# Patient Record
Sex: Female | Born: 1975 | Race: Black or African American | Hispanic: No | Smoking: Never smoker
Health system: Southern US, Community
[De-identification: ages and names within clinical notes are randomized; demographics above are authoritative.]

## PROBLEM LIST (undated history)

## (undated) DIAGNOSIS — T7840XA Allergy, unspecified, initial encounter: Secondary | ICD-10-CM

## (undated) DIAGNOSIS — R569 Unspecified convulsions: Secondary | ICD-10-CM

## (undated) DIAGNOSIS — J45909 Unspecified asthma, uncomplicated: Secondary | ICD-10-CM

## (undated) HISTORY — PX: BILATERAL CARPAL TUNNEL RELEASE: SHX6508

## (undated) HISTORY — DX: Unspecified asthma, uncomplicated: J45.909

## (undated) HISTORY — DX: Allergy, unspecified, initial encounter: T78.40XA

---

## 2007-06-08 ENCOUNTER — Emergency Department (HOSPITAL_COMMUNITY): Admission: EM | Admit: 2007-06-08 | Discharge: 2007-06-08 | Payer: Self-pay | Admitting: Emergency Medicine

## 2007-07-27 ENCOUNTER — Emergency Department (HOSPITAL_COMMUNITY): Admission: EM | Admit: 2007-07-27 | Discharge: 2007-07-27 | Payer: Self-pay | Admitting: Emergency Medicine

## 2009-05-25 ENCOUNTER — Emergency Department (HOSPITAL_COMMUNITY): Admission: EM | Admit: 2009-05-25 | Discharge: 2009-05-25 | Payer: Self-pay | Admitting: Emergency Medicine

## 2009-08-21 ENCOUNTER — Emergency Department (HOSPITAL_COMMUNITY): Admission: EM | Admit: 2009-08-21 | Discharge: 2009-08-21 | Payer: Self-pay | Admitting: Emergency Medicine

## 2010-06-08 ENCOUNTER — Ambulatory Visit: Payer: Self-pay | Admitting: Cardiology

## 2010-10-30 LAB — DIFFERENTIAL
Basophils Absolute: 0 10*3/uL (ref 0.0–0.1)
Eosinophils Absolute: 0.3 10*3/uL (ref 0.0–0.7)
Lymphocytes Relative: 18 % (ref 12–46)
Lymphs Abs: 1.1 10*3/uL (ref 0.7–4.0)
Monocytes Absolute: 0.7 10*3/uL (ref 0.1–1.0)
Neutrophils Relative %: 65 % (ref 43–77)

## 2010-10-30 LAB — CBC
Hemoglobin: 12.1 g/dL (ref 12.0–15.0)
Platelets: 270 10*3/uL (ref 150–400)

## 2010-10-30 LAB — BASIC METABOLIC PANEL
BUN: 8 mg/dL (ref 6–23)
CO2: 27 mEq/L (ref 19–32)
Chloride: 105 mEq/L (ref 96–112)
GFR calc Af Amer: >60 mL/min (ref >60)
GFR calc non Af Amer: >60 mL/min (ref >60)
Glucose, Bld: 91 mg/dL (ref 70–99)
Potassium: 3.7 mEq/L (ref 3.5–5.1)
Sodium: 138 mEq/L (ref 135–145)

## 2011-05-22 LAB — STREP A DNA PROBE: Group A Strep Probe: NEGATIVE

## 2011-05-24 LAB — CBC
HCT: 33.2 — ABNORMAL LOW
Hemoglobin: 11.4 — ABNORMAL LOW
Platelets: 289
RBC: 3.66 — ABNORMAL LOW
RDW: 12.8

## 2011-05-24 LAB — DIFFERENTIAL
Basophils Absolute: 0
Eosinophils Relative: 8 — ABNORMAL HIGH
Lymphocytes Relative: 26
Lymphs Abs: 2.2
Neutro Abs: 4.7

## 2011-05-24 LAB — BASIC METABOLIC PANEL
CO2: 27
Calcium: 9.4
GFR calc Af Amer: >60
Glucose, Bld: 91

## 2011-05-24 LAB — POCT CARDIAC MARKERS
CKMB, poc: <1 — ABNORMAL LOW
Myoglobin, poc: 40.5
Troponin i, poc: <0.05

## 2013-09-05 ENCOUNTER — Emergency Department (HOSPITAL_COMMUNITY)
Admission: EM | Admit: 2013-09-05 | Discharge: 2013-09-05 | Disposition: A | Discharge status: Home / Self Care | Payer: PRIVATE HEALTH INSURANCE | Attending: Emergency Medicine | Admitting: Emergency Medicine

## 2013-09-05 ENCOUNTER — Encounter (HOSPITAL_COMMUNITY): Payer: Self-pay | Admitting: Emergency Medicine

## 2013-09-05 DIAGNOSIS — Z3202 Encounter for pregnancy test, result negative: Secondary | ICD-10-CM | POA: Insufficient documentation

## 2013-09-05 DIAGNOSIS — R110 Nausea: Secondary | ICD-10-CM | POA: Insufficient documentation

## 2013-09-05 LAB — COMPREHENSIVE METABOLIC PANEL
ALK PHOS: 62 U/L (ref 39–117)
ALT: 12 U/L (ref 0–35)
AST: 12 U/L (ref 0–37)
Albumin: 3.4 g/dL — ABNORMAL LOW (ref 3.5–5.2)
BUN: 15 mg/dL (ref 6–23)
CHLORIDE: 103 meq/L (ref 96–112)
CO2: 24 mEq/L (ref 19–32)
CREATININE: 0.73 mg/dL (ref 0.50–1.10)
Calcium: 8.4 mg/dL (ref 8.4–10.5)
GFR calc non Af Amer: >90 mL/min (ref >90)
Glucose, Bld: 102 mg/dL — ABNORMAL HIGH (ref 70–99)
Potassium: 3.8 mEq/L (ref 3.7–5.3)
SODIUM: 138 meq/L (ref 137–147)
TOTAL PROTEIN: 6.7 g/dL (ref 6.0–8.3)
Total Bilirubin: 0.2 mg/dL — ABNORMAL LOW (ref 0.3–1.2)

## 2013-09-05 LAB — CBC WITH DIFFERENTIAL/PLATELET
BASOS PCT: 0 % (ref 0–1)
Basophils Absolute: 0 10*3/uL (ref 0.0–0.1)
EOS PCT: 4 % (ref 0–5)
Eosinophils Absolute: 0.4 10*3/uL (ref 0.0–0.7)
HEMATOCRIT: 34.2 % — AB (ref 36.0–46.0)
HEMOGLOBIN: 11.8 g/dL — AB (ref 12.0–15.0)
LYMPHS ABS: 1.6 10*3/uL (ref 0.7–4.0)
LYMPHS PCT: 17 % (ref 12–46)
MCH: 31.1 pg (ref 26.0–34.0)
MCHC: 34.5 g/dL (ref 30.0–36.0)
MCV: 90 fL (ref 78.0–100.0)
MONO ABS: 0.8 10*3/uL (ref 0.1–1.0)
Monocytes Relative: 9 % (ref 3–12)
Neutro Abs: 6.6 10*3/uL (ref 1.7–7.7)
Neutrophils Relative %: 70 % (ref 43–77)
Platelets: 248 10*3/uL (ref 150–400)
RBC: 3.8 MIL/uL — ABNORMAL LOW (ref 3.87–5.11)
RDW: 12.6 % (ref 11.5–15.5)
WBC: 9.4 10*3/uL (ref 4.0–10.5)

## 2013-09-05 LAB — URINALYSIS, ROUTINE W REFLEX MICROSCOPIC
Bilirubin Urine: NEGATIVE
GLUCOSE, UA: NEGATIVE mg/dL
KETONES UR: NEGATIVE mg/dL
NITRITE: NEGATIVE
PH: 5.5 (ref 5.0–8.0)
Protein, ur: NEGATIVE mg/dL
Specific Gravity, Urine: 1.022 (ref 1.005–1.030)
UROBILINOGEN UA: 0.2 mg/dL (ref 0.0–1.0)

## 2013-09-05 LAB — URINE MICROSCOPIC-ADD ON

## 2013-09-05 LAB — POCT PREGNANCY, URINE: PREG TEST UR: NEGATIVE

## 2013-09-05 MED ORDER — PROMETHAZINE HCL 25 MG PO TABS: 25.0000 mg | ORAL_TABLET | Freq: Four times a day (QID) | ORAL | Status: DC | PRN

## 2013-09-05 NOTE — Discharge Instructions (Signed)
Nausea, Adult Nausea is the feeling that you have an upset stomach or have to vomit. Nausea by itself is not likely a serious concern, but it may be an early sign of more serious medical problems. As nausea gets worse, it can lead to vomiting. If vomiting develops, there is the risk of dehydration.  CAUSES   Viral infections.  Food poisoning.  Medicines.  Pregnancy.  Motion sickness.  Migraine headaches.  Emotional distress.  Severe pain from any source.  Alcohol intoxication. HOME CARE INSTRUCTIONS  Get plenty of rest.  Ask your caregiver about specific rehydration instructions.  Eat small amounts of food and sip liquids more often.  Take all medicines as told by your caregiver. SEEK MEDICAL CARE IF:  You have not improved after 2 days, or you get worse.  You have a headache. SEEK IMMEDIATE MEDICAL CARE IF:   You have a fever.  You faint.  You keep vomiting or have blood in your vomit.  You are extremely weak or dehydrated.  You have dark or bloody stools.  You have severe chest or abdominal pain. MAKE SURE YOU:  Understand these instructions.  Will watch your condition.  Will get help right away if you are not doing well or get worse. Document Released: 09/07/2004 Document Revised: 04/24/2012 Document Reviewed: 04/12/2011 [employer] Patient Information 2014 [employer].  

## 2013-09-05 NOTE — ED Notes (Signed)
Pt refused pelvic exam.  Stated "I just wanted to know if I was pregnant.  That came back and I am not so I want to go now."  Dr Cheri Guppy informed.  Pt to be discharged.

## 2013-09-05 NOTE — ED Notes (Addendum)
Pt states she has been having abd pain for four days.  Pt states last BM over one day prior which is abnormal for her.  Pt states some nausea, but denies vomiting and breast tenderness.

## 2013-09-05 NOTE — ED Provider Notes (Signed)
CSN: [id number]     Arrival date & time 09/05/13  0212 History   First MD Initiated Contact with Patient 09/05/13 0304     Chief Complaint  Patient presents with  . Abdominal Pain   (Consider location/radiation/quality/duration/timing/severity/associated sxs/prior Treatment) HPI This patient is a generally healthy 47 old woman who presents to the emergency department because she's concerned she may be pregnant. She has not taken home pregnancy test. Her last menstrual period was 3 weeks ago. She has felt intermittently nauseated for the past couple of days and has had some very mild and intermittent suprapubic discomfort. She has not appreciated any unusual vaginal discharge or drainage. She denies dysuria. No fever. She has not had any vomiting. She has not done home pregnancy test.   History reviewed. No pertinent past medical history. History reviewed. No pertinent past surgical history. No family history on file. History  Substance Use Topics  . Smoking status: Never Smoker   . Smokeless tobacco: Not on file  . Alcohol Use: No   OB History   Grav Para Term Preterm Abortions TAB SAB Ect Mult Living                 Review of Systems Ten point review of symptoms performed and is negative with the exception of symptoms noted above.   Allergies  Review of patient's allergies indicates no known allergies.  Home Medications  No current outpatient prescriptions on file. LMP 08/19/2013 Physical Exam Gen: well developed and well nourished appearing Head: NCAT Eyes: PERL, EOMI Nose: no epistaixis or rhinorrhea Mouth/throat: mucosa is moist and pink Neck: supple, no stridor Lungs: CTA B, no wheezing, rhonchi or rales CV: RRR, no murmur, extremities appear well perfused.  Abd: soft, notender, nondistended Back: no ttp, no cva ttp Skin: warm and dry Ext: normal to inspection, no dependent edema Neuro: CN ii-xii grossly intact, no focal deficits Psyche; normal affect,  calm and  cooperative.   ED Course  Procedures (including critical care time) Labs Review Results for orders placed during the hospital encounter of 09/05/13 (from the past 24 hour(s))  URINALYSIS, ROUTINE W REFLEX MICROSCOPIC     Status: Abnormal   Collection Time    09/05/13  2:34 AM      Result Value Range   Color, Urine YELLOW  YELLOW   APPearance CLOUDY (*) CLEAR   Specific Gravity, Urine 1.022  1.005 - 1.030   pH 5.5  5.0 - 8.0   Glucose, UA NEGATIVE  NEGATIVE mg/dL   Hgb urine dipstick LARGE (*) NEGATIVE   Bilirubin Urine NEGATIVE  NEGATIVE   Ketones, ur NEGATIVE  NEGATIVE mg/dL   Protein, ur NEGATIVE  NEGATIVE mg/dL   Urobilinogen, UA 0.2  0.0 - 1.0 mg/dL   Nitrite NEGATIVE  NEGATIVE   Leukocytes, UA SMALL (*) NEGATIVE  URINE MICROSCOPIC-ADD ON     Status: Abnormal   Collection Time    09/05/13  2:34 AM      Result Value Range   Squamous Epithelial / LPF FEW (*) RARE   WBC, UA 0-2  <3 WBC/hpf   Bacteria, UA FEW (*) RARE  POCT PREGNANCY, URINE     Status: None   Collection Time    09/05/13  2:38 AM      Result Value Range   Preg Test, Ur NEGATIVE  NEGATIVE  CBC WITH DIFFERENTIAL     Status: Abnormal   Collection Time    09/05/13  2:46 AM  Result Value Range   WBC 9.4  4.0 - 10.5 K/uL   RBC 3.80 (*) 3.87 - 5.11 MIL/uL   Hemoglobin 11.8 (*) 12.0 - 15.0 g/dL   HCT 34.2 (*) 36.0 - 46.0 %   MCV 90.0  78.0 - 100.0 fL   MCH 31.1  26.0 - 34.0 pg   MCHC 34.5  30.0 - 36.0 g/dL   RDW 12.6  11.5 - 15.5 %   Platelets 248  150 - 400 K/uL   Neutrophils Relative % 70  43 - 77 %   Neutro Abs 6.6  1.7 - 7.7 K/uL   Lymphocytes Relative 17  12 - 46 %   Lymphs Abs 1.6  0.7 - 4.0 K/uL   Monocytes Relative 9  3 - 12 %   Monocytes Absolute 0.8  0.1 - 1.0 K/uL   Eosinophils Relative 4  0 - 5 %   Eosinophils Absolute 0.4  0.0 - 0.7 K/uL   Basophils Relative 0  0 - 1 %   Basophils Absolute 0.0  0.0 - 0.1 K/uL  COMPREHENSIVE METABOLIC PANEL     Status: Abnormal   Collection Time     09/05/13  2:46 AM      Result Value Range   Sodium 138  137 - 147 mEq/L   Potassium 3.8  3.7 - 5.3 mEq/L   Chloride 103  96 - 112 mEq/L   CO2 24  19 - 32 mEq/L   Glucose, Bld 102 (*) 70 - 99 mg/dL   BUN 15  6 - 23 mg/dL   Creatinine, Ser 0.73  0.50 - 1.10 mg/dL   Calcium 8.4  8.4 - 10.5 mg/dL   Total Protein 6.7  6.0 - 8.3 g/dL   Albumin 3.4 (*) 3.5 - 5.2 g/dL   AST 12  0 - 37 U/L   ALT 12  0 - 35 U/L   Alkaline Phosphatase 62  39 - 117 U/L   Total Bilirubin 0.2 (*) 0.3 - 1.2 mg/dL   GFR calc non Af Amer >90  >90 mL/min   GFR calc Af Amer >90  >90 mL/min    MDM  DDX: uti, cervicitis, pid, functional abdominal pain, endometriosis, complication of pregnancy  0345:  I have been informed by the patient's nurse that she declines pelvic exam and came to the emergency department for a pregnancy test and would no like to be discharged.   Elyn Peers, MD 09/05/13 [phone]

## 2013-09-06 ENCOUNTER — Encounter (HOSPITAL_COMMUNITY): Payer: Self-pay | Admitting: Emergency Medicine

## 2013-09-06 ENCOUNTER — Emergency Department (HOSPITAL_COMMUNITY): Payer: PRIVATE HEALTH INSURANCE

## 2013-09-06 ENCOUNTER — Emergency Department (HOSPITAL_COMMUNITY)
Admission: EM | Admit: 2013-09-06 | Discharge: 2013-09-06 | Disposition: A | Discharge status: Home / Self Care | Payer: PRIVATE HEALTH INSURANCE | Attending: Emergency Medicine | Admitting: Emergency Medicine

## 2013-09-06 DIAGNOSIS — N949 Unspecified condition associated with female genital organs and menstrual cycle: Secondary | ICD-10-CM | POA: Insufficient documentation

## 2013-09-06 DIAGNOSIS — N938 Other specified abnormal uterine and vaginal bleeding: Secondary | ICD-10-CM | POA: Insufficient documentation

## 2013-09-06 DIAGNOSIS — N939 Abnormal uterine and vaginal bleeding, unspecified: Secondary | ICD-10-CM

## 2013-09-06 DIAGNOSIS — Z3202 Encounter for pregnancy test, result negative: Secondary | ICD-10-CM | POA: Insufficient documentation

## 2013-09-06 DIAGNOSIS — N6459 Other signs and symptoms in breast: Secondary | ICD-10-CM | POA: Insufficient documentation

## 2013-09-06 DIAGNOSIS — N946 Dysmenorrhea, unspecified: Secondary | ICD-10-CM | POA: Insufficient documentation

## 2013-09-06 LAB — WET PREP, GENITAL
TRICH WET PREP: NONE SEEN
YEAST WET PREP: NONE SEEN

## 2013-09-06 LAB — POCT PREGNANCY, URINE: Preg Test, Ur: NEGATIVE

## 2013-09-06 MED ORDER — ACETAMINOPHEN 325 MG PO TABS
650.0000 mg | ORAL_TABLET | Freq: Once | ORAL | Status: DC
  Administered 2013-09-06: 650 mg via ORAL
  Filled 2013-09-06: qty 2

## 2013-09-06 MED ORDER — ACETAMINOPHEN 325 MG PO TABS: 650.0000 mg | ORAL_TABLET | Freq: Once | ORAL | Status: AC

## 2013-09-06 MED ORDER — HYDROCODONE-ACETAMINOPHEN 5-325 MG PO TABS: 1.0000 | ORAL_TABLET | ORAL | Status: DC | PRN

## 2013-09-06 NOTE — ED Notes (Signed)
Pt still out of the dept at this time

## 2013-09-06 NOTE — Discharge Instructions (Signed)
Abnormal Uterine Bleeding Abnormal uterine bleeding can affect women at various stages in life, including teenagers, women in their reproductive years, pregnant women, and women who have reached menopause. Several kinds of uterine bleeding are considered abnormal, including:  Bleeding or spotting between periods.   Bleeding after sexual intercourse.   Bleeding that is heavier or more than normal.   Periods that last longer than usual.  Bleeding after menopause.  Many cases of abnormal uterine bleeding are minor and simple to treat, while others are more serious. Any type of abnormal bleeding should be evaluated by your health care provider. Treatment will depend on the cause of the bleeding. HOME CARE INSTRUCTIONS Monitor your condition for any changes. The following actions may help to alleviate any discomfort you are experiencing:  Avoid the use of tampons and douches as directed by your health care provider.  Change your pads frequently. You should get regular pelvic exams and Pap tests. Keep all follow-up appointments for diagnostic tests as directed by your health care provider.  SEEK MEDICAL CARE IF:   Your bleeding lasts more than 1 week.   You feel dizzy at times.  SEEK IMMEDIATE MEDICAL CARE IF:   You pass out.   You are changing pads every 15 to 30 minutes.   You have abdominal pain.  You have a fever.   You become sweaty or weak.   You are passing large blood clots from the vagina.   You start to feel nauseous and vomit. MAKE SURE YOU:   Understand these instructions.  Will watch your condition.  Will get help right away if you are not doing well or get worse. Document Released: 07/31/2005 Document Revised: 04/02/2013 Document Reviewed: 02/27/2013 [facility] Patient Information 2014 [place], [place].  Dysmenorrhea Menstrual cramps (dysmenorrhea) are caused by the muscles of the uterus tightening (contracting) during a menstrual period. For some  women, this discomfort is merely bothersome. For others, dysmenorrhea can be severe enough to interfere with everyday activities for a few days each month. Primary dysmenorrhea is menstrual cramps that last a couple of days when you start having menstrual periods or soon after. This often begins after a teenager starts having her period. As a woman gets older or has a baby, the cramps will usually lessen or disappear. Secondary dysmenorrhea begins later in life, lasts longer, and the pain may be stronger than primary dysmenorrhea. The pain may start before the period and last a few days after the period.  CAUSES  Dysmenorrhea is usually caused by an underlying problem, such as:  The tissue lining the uterus grows outside of the uterus in other areas of the body (endometriosis).  The endometrial tissue, which normally lines the uterus, is found in or grows into the muscular walls of the uterus (adenomyosis).  The pelvic blood vessels are engorged with blood just before the menstrual period (pelvic congestive syndrome).  Overgrowth of cells (polyps) in the lining of the uterus or cervix.  Falling down of the uterus (prolapse) because of loose or stretched ligaments.  Depression.  Bladder problems, infection, or inflammation.  Problems with the intestine, a tumor, or irritable bowel syndrome.  Cancer of the female organs or bladder.  A severely tipped uterus.  A very tight opening or closed cervix.  Noncancerous tumors of the uterus (fibroids).  Pelvic inflammatory disease (PID).  Pelvic scarring (adhesions) from a previous surgery.  Ovarian cyst.  An intrauterine device (IUD) used for birth control. RISK FACTORS You may be at greater risk  of dysmenorrhea if:  You are younger than age 80.  You started puberty early.  You have irregular or heavy bleeding.  You have never given birth.  You have a family history of this problem.  You are a smoker. SIGNS AND SYMPTOMS    Cramping or throbbing pain in your lower abdomen.  Headaches.  Lower back pain.  Nausea or vomiting.  Diarrhea.  Sweating or dizziness.  Loose stools. DIAGNOSIS  A diagnosis is based on your history, symptoms, physical exam, diagnostic tests, or procedures. Diagnostic tests or procedures may include:  Blood tests.  Ultrasonography.  An examination of the lining of the uterus (dilation and curettage, D&C).  An examination inside your abdomen or pelvis with a scope (laparoscopy).  X-rays.  CT scan.  MRI.  An examination inside the bladder with a scope (cystoscopy).  An examination inside the intestine or stomach with a scope (colonoscopy, gastroscopy). TREATMENT  Treatment depends on the cause of the dysmenorrhea. Treatment may include:  Pain medicine prescribed by your health care provider.  Birth control pills or an IUD with progesterone hormone in it.  Hormone replacement therapy.  Nonsteroidal anti-inflammatory drugs (NSAIDs). These may help stop the production of prostaglandins.  Surgery to remove adhesions, endometriosis, ovarian cyst, or fibroids.  Removal of the uterus (hysterectomy).  Progesterone shots to stop the menstrual period.  Cutting the nerves on the sacrum that go to the female organs (presacral neurectomy).  Electric current to the sacral nerves (sacral nerve stimulation).  Antidepressant medicine.  Psychiatric therapy, counseling, or group therapy.  Exercise and physical therapy.  Meditation and yoga therapy.  Acupuncture. HOME CARE INSTRUCTIONS   Only take over-the-counter or prescription medicines as directed by your health care provider.  Place a heating pad or hot water bottle on your lower back or abdomen. Do not sleep with the heating pad.  Use aerobic exercises, walking, swimming, biking, and other exercises to help lessen the cramping.  Massage to the lower back or abdomen may help.  Stop smoking.  Avoid alcohol  and caffeine. SEEK MEDICAL CARE IF:   Your pain does not get better with medicine.  You have pain with sexual intercourse.  Your pain increases and is not controlled with medicines.  You have abnormal vaginal bleeding with your period.  You develop nausea or vomiting with your period that is not controlled with medicine. SEEK IMMEDIATE MEDICAL CARE IF:  You pass out.  Document Released: 07/31/2005 Document Revised: 04/02/2013 Document Reviewed: 01/16/2013 [facility] Patient Information 2014 [place].

## 2013-09-06 NOTE — ED Notes (Signed)
Generalized abdominal pain with nausea. Pt. Denies any vomiting.  Pt. Also reports having drainage from her breasts with sorenes.   Denies any UTI symptoms.  Pt. Is having vaginal bleeding

## 2013-09-06 NOTE — ED Provider Notes (Signed)
CSN: [id number]     Arrival date & time 09/06/13  0709 History   First MD Initiated Contact with Patient 09/06/13 [phone]     Chief Complaint  Patient presents with  . Abdominal Pain   (Consider location/radiation/quality/duration/timing/severity/associated sxs/prior Treatment) HPI  Megan Harvey is a 38 y.o.female without any significant PMH presents to the ER with complaints of suprapubic cramping, nausea, vaginal bleeding and bilateral breast discharge. She was seen yesterday for the same, had blood work and a urine pregnancy test done, all of which were WNL, negative urine preg. She declined pelvic exam at the time because she was feeling better and asked to go home.  This morning she comes back because she feels as though the bleeding is more severe. She is concerned she is pregnant and may be loosing the baby. She is very tearful. Denies urinary symptoms. Denies diffuse abdominal pain. Denies pain in her breasts.    History reviewed. No pertinent past medical history. History reviewed. No pertinent past surgical history. No family history on file. History  Substance Use Topics  . Smoking status: Never Smoker   . Smokeless tobacco: Not on file  . Alcohol Use: No   OB History   Grav Para Term Preterm Abortions TAB SAB Ect Mult Living                 Review of Systems The patient denies anorexia, fever, weight loss,, vision loss, decreased hearing, hoarseness, chest pain, syncope, dyspnea on exertion, peripheral edema, balance deficits, hemoptysis, abdominal pain, melena, hematochezia, severe indigestion/heartburn, hematuria, incontinence, genital sores, muscle weakness, suspicious skin lesions, transient blindness, difficulty walking, depression, unusual weight change, abnormal bleeding, enlarged lymph nodes, angioedema, and breast masses.  Allergies  Review of patient's allergies indicates no known allergies.  Home Medications   Current Outpatient Rx  Name  Route  Sig   Dispense  Refill  . promethazine (PHENERGAN) 25 MG tablet   Oral   Take 1 tablet (25 mg total) by mouth every 6 (six) hours as needed for nausea or vomiting.   30 tablet   0    BP 107/75  Pulse 75  Temp(Src) 98.8 F (37.1 C) (Oral)  Resp 20  SpO2 98%  LMP 08/20/2013 Physical Exam  Nursing note and vitals reviewed. Constitutional: She appears well-developed and well-nourished. No distress.  HENT:  Head: Normocephalic and atraumatic.  Eyes: Pupils are equal, round, and reactive to light.  Neck: Normal range of motion. Neck supple.  Cardiovascular: Normal rate and regular rhythm.   Pulmonary/Chest: Effort normal.  Abdominal: Soft.  Genitourinary: Uterus is tender. Right adnexum displays tenderness and fullness. There is tenderness and bleeding around the vagina.  Neurological: She is alert.  Skin: Skin is warm and dry.    ED Course  Procedures (including critical care time) Labs Review Labs Reviewed  WET PREP, GENITAL - Abnormal; Notable for the following:    Clue Cells Wet Prep HPF POC RARE (*)    WBC, Wet Prep HPF POC RARE (*)    All other components within normal limits  GC/CHLAMYDIA PROBE AMP  POCT PREGNANCY, URINE   Imaging Review US Transvaginal Non-ob  09/06/2013   CLINICAL DATA:  Vaginal bleeding, right-sided adnexal tenderness and cervical motion tenderness at physical exam  EXAM: TRANSABDOMINAL AND TRANSVAGINAL ULTRASOUND OF PELVIS  TECHNIQUE: Both transabdominal and transvaginal ultrasound examinations of the pelvis were performed. Transabdominal technique was performed for global imaging of the pelvis including uterus, ovaries, adnexal regions, and pelvic cul-de-sac.  It was necessary to proceed with endovaginal exam following the transabdominal exam to visualize the endometrium and ovaries.  COMPARISON:  None  FINDINGS: Uterus  Measurements: 7.7 x 5.7 x 4.4 cm. Anteverted, anteflexed. No focal abnormality.  Endometrium  Thickness: 5 mm.  Uniformly echogenic without  focal abnormality.  Right ovary  Measurements: 2.6 x 1.7 x 1.5 cm. Not well visualized due to patient discomfort but unremarkable in appearance. Normal appearance/no adnexal mass.  Left ovary  Measurements: 2.3 x 1.7 x 1.5 cm. Normal appearance/no adnexal mass.  Other findings  Trace free fluid identified.  IMPRESSION: No acute intrapelvic abnormality.   Electronically Signed   By: Conchita Paris M.D.   On: 09/06/2013 10:04   US Pelvis Complete  09/06/2013   CLINICAL DATA:  Vaginal bleeding, right-sided adnexal tenderness and cervical motion tenderness at physical exam  EXAM: TRANSABDOMINAL AND TRANSVAGINAL ULTRASOUND OF PELVIS  TECHNIQUE: Both transabdominal and transvaginal ultrasound examinations of the pelvis were performed. Transabdominal technique was performed for global imaging of the pelvis including uterus, ovaries, adnexal regions, and pelvic cul-de-sac. It was necessary to proceed with endovaginal exam following the transabdominal exam to visualize the endometrium and ovaries.  COMPARISON:  None  FINDINGS: Uterus  Measurements: 7.7 x 5.7 x 4.4 cm. Anteverted, anteflexed. No focal abnormality.  Endometrium  Thickness: 5 mm.  Uniformly echogenic without focal abnormality.  Right ovary  Measurements: 2.6 x 1.7 x 1.5 cm. Not well visualized due to patient discomfort but unremarkable in appearance. Normal appearance/no adnexal mass.  Left ovary  Measurements: 2.3 x 1.7 x 1.5 cm. Normal appearance/no adnexal mass.  Other findings  Trace free fluid identified.  IMPRESSION: No acute intrapelvic abnormality.   Electronically Signed   By: Conchita Paris M.D.   On: 09/06/2013 10:04    EKG Interpretation   None       MDM   1. Dysmenorrhea   2. Abnormal uterine bleeding     CMP, urinalysis, cbc done yesterday WNL. Urine preg done yesterday- negative Urine preg today- negative  Patient has abnormal pelvic exam with significant tenderness, will obtain US of uterus (NON-OB) to evaluate  further.  Patients ultrasounds is reassure. No masses, endometriosis or fibroids. Pt is having an abnormal menstrual cycle. Will refer to [facility]' clinic for further treatment and evaluation. 38 y.o.Megan Harvey Garret McGhee's evaluation in the Emergency Department is complete. It has been determined that no acute conditions requiring further emergency intervention are present at this time. The patient/guardian have been advised of the diagnosis and plan. We have discussed signs and symptoms that warrant return to the ED, such as changes or worsening in symptoms.  Vital signs are stable at discharge. Filed Vitals:   09/06/13 0713  BP: 107/75  Pulse: 75  Temp: 98.8 F (37.1 C)  Resp: 20    Patient/guardian has voiced understanding and agreed to follow-up with the PCP or specialist.       Linus Mako, PA-C 09/06/13 1010

## 2013-09-06 NOTE — ED Notes (Signed)
Pelvic cart set up at bed side, pt removing clothes from waist down; Tiffany PA notified

## 2013-09-06 NOTE — ED Notes (Signed)
Patient discharged using the teach back method,she verbalizes an understanding

## 2013-09-06 NOTE — ED Notes (Signed)
Tylenol pending patients return from [place].

## 2013-09-06 NOTE — ED Notes (Signed)
Back from US.

## 2013-09-06 NOTE — ED Notes (Signed)
Patient transported to Ultrasound 

## 2013-09-08 LAB — GC/CHLAMYDIA PROBE AMP
CT PROBE, AMP APTIMA: NEGATIVE
GC PROBE AMP APTIMA: NEGATIVE

## 2013-09-11 ENCOUNTER — Ambulatory Visit: Payer: Self-pay | Admitting: Obstetrics & Gynecology

## 2013-09-12 NOTE — ED Provider Notes (Signed)
Medical screening examination/treatment/procedure(s) were performed by non-physician practitioner and as supervising physician I was immediately available for consultation/collaboration.  Leota Jacobsen, MD 09/12/13 [phone]

## 2013-12-02 ENCOUNTER — Ambulatory Visit: Payer: Self-pay

## 2014-03-15 ENCOUNTER — Emergency Department (HOSPITAL_COMMUNITY)
Admission: EM | Admit: 2014-03-15 | Discharge: 2014-03-15 | Discharge status: Against Medical Advice | Payer: PRIVATE HEALTH INSURANCE | Attending: Emergency Medicine | Admitting: Emergency Medicine

## 2014-03-15 ENCOUNTER — Emergency Department (HOSPITAL_COMMUNITY): Payer: PRIVATE HEALTH INSURANCE

## 2014-03-15 ENCOUNTER — Encounter (HOSPITAL_COMMUNITY): Payer: Self-pay | Admitting: Emergency Medicine

## 2014-03-15 DIAGNOSIS — R109 Unspecified abdominal pain: Secondary | ICD-10-CM | POA: Insufficient documentation

## 2014-03-15 DIAGNOSIS — Z880 Allergy status to penicillin: Secondary | ICD-10-CM | POA: Insufficient documentation

## 2014-03-15 DIAGNOSIS — Z3202 Encounter for pregnancy test, result negative: Secondary | ICD-10-CM | POA: Insufficient documentation

## 2014-03-15 LAB — CBC WITH DIFFERENTIAL/PLATELET
Basophils Absolute: 0 10*3/uL (ref 0.0–0.1)
Basophils Relative: 0 % (ref 0–1)
Eosinophils Absolute: 0.4 10*3/uL (ref 0.0–0.7)
Eosinophils Relative: 5 % (ref 0–5)
HEMATOCRIT: 36.8 % (ref 36.0–46.0)
HEMOGLOBIN: 12.5 g/dL (ref 12.0–15.0)
Lymphocytes Relative: 17 % (ref 12–46)
Lymphs Abs: 1.4 10*3/uL (ref 0.7–4.0)
MCH: 30.1 pg (ref 26.0–34.0)
MCHC: 34 g/dL (ref 30.0–36.0)
MCV: 88.7 fL (ref 78.0–100.0)
MONO ABS: 0.7 10*3/uL (ref 0.1–1.0)
MONOS PCT: 9 % (ref 3–12)
Neutro Abs: 5.4 10*3/uL (ref 1.7–7.7)
Neutrophils Relative %: 69 % (ref 43–77)
Platelets: 262 10*3/uL (ref 150–400)
RBC: 4.15 MIL/uL (ref 3.87–5.11)
RDW: 12.5 % (ref 11.5–15.5)
WBC: 7.9 10*3/uL (ref 4.0–10.5)

## 2014-03-15 LAB — POC URINE PREG, ED: Preg Test, Ur: NEGATIVE

## 2014-03-15 LAB — COMPREHENSIVE METABOLIC PANEL
ALT: 15 U/L (ref 0–35)
ANION GAP: 10 (ref 5–15)
AST: 17 U/L (ref 0–37)
Albumin: 3.8 g/dL (ref 3.5–5.2)
Alkaline Phosphatase: 63 U/L (ref 39–117)
BILIRUBIN TOTAL: 0.5 mg/dL (ref 0.3–1.2)
BUN: 9 mg/dL (ref 6–23)
CHLORIDE: 104 meq/L (ref 96–112)
CO2: 24 mEq/L (ref 19–32)
CREATININE: 0.76 mg/dL (ref 0.50–1.10)
Calcium: 9.2 mg/dL (ref 8.4–10.5)
GFR calc Af Amer: >90 mL/min (ref >90)
GFR calc non Af Amer: >90 mL/min (ref >90)
Glucose, Bld: 82 mg/dL (ref 70–99)
Potassium: 4 mEq/L (ref 3.7–5.3)
Sodium: 138 mEq/L (ref 137–147)
Total Protein: 7 g/dL (ref 6.0–8.3)

## 2014-03-15 LAB — URINALYSIS, ROUTINE W REFLEX MICROSCOPIC
Bilirubin Urine: NEGATIVE
Glucose, UA: NEGATIVE mg/dL
HGB URINE DIPSTICK: NEGATIVE
KETONES UR: NEGATIVE mg/dL
Leukocytes, UA: NEGATIVE
Nitrite: NEGATIVE
PROTEIN: NEGATIVE mg/dL
Specific Gravity, Urine: 1.013 (ref 1.005–1.030)
Urobilinogen, UA: 0.2 mg/dL (ref 0.0–1.0)
pH: 6.5 (ref 5.0–8.0)

## 2014-03-15 LAB — LIPASE, BLOOD: Lipase: 24 U/L (ref 11–59)

## 2014-03-15 MED ORDER — KETOROLAC TROMETHAMINE 60 MG/2ML IM SOLN
60.0000 mg | Freq: Once | INTRAMUSCULAR | Status: DC
  Filled 2014-03-15: qty 2

## 2014-03-15 NOTE — ED Notes (Signed)
Patient refused pelvic exam and medication. Provider notified.

## 2014-03-15 NOTE — ED Notes (Addendum)
Patient seen walking in hallway of ED. Stopped patient asked where she is going? Stated "going home" Explain nurse will notifiy provider you want to go home. Stated " I am going home" and left the ED. Ambulatory steady gait. Provider noted.

## 2014-03-15 NOTE — ED Notes (Signed)
Pt c/o lower abdominal pain x 3 days. Pt denies N/v or vaginal discharge.

## 2014-03-15 NOTE — ED Provider Notes (Signed)
CSN: [id number]     Arrival date & time 03/15/14  1205 History   First MD Initiated Contact with Patient 03/15/14 1413     Chief Complaint  Patient presents with  . Abdominal Pain     (Consider location/radiation/quality/duration/timing/severity/associated sxs/prior Treatment) HPI Megan Harvey is a 38 y.o. female who presents to ED with complaint of lower abdominal pain. Patient states her pain began 3 days ago. States it does not radiate. States feels like cramps. Pain comes and goes. Patient reports she is supposed to start her period yesterday but has not began bleeding at. She has not taken any for pain. She does report urinary frequency, denies urinary urgency, hematuria. She denies any abnormal vaginal discharge. She's unsure she's pregnant. She denies any nausea, vomiting, fever, chills. States she has had normal bowel movements, last bowel movement was yesterday. She said she has had similar pain in the past, and does not remember what she was diagnosed with. She denies anorexia. Nothing is making her pain better or worse. No prior abdominal surgeries.  History reviewed. No pertinent past medical history. History reviewed. No pertinent past surgical history. No family history on file. History  Substance Use Topics  . Smoking status: Never Smoker   . Smokeless tobacco: Not on file  . Alcohol Use: No   OB History   Grav Para Term Preterm Abortions TAB SAB Ect Mult Living                 Review of Systems  Constitutional: Negative for fever and chills.  Respiratory: Negative for cough, chest tightness and shortness of breath.   Cardiovascular: Negative for chest pain, palpitations and leg swelling.  Gastrointestinal: Positive for abdominal pain. Negative for nausea, vomiting and diarrhea.  Genitourinary: Positive for frequency. Negative for dysuria, hematuria, flank pain, vaginal bleeding, vaginal discharge, vaginal pain and pelvic pain.  Musculoskeletal: Negative for  arthralgias, myalgias, neck pain and neck stiffness.  Skin: Negative for rash.  Neurological: Negative for dizziness, weakness and headaches.  All other systems reviewed and are negative.     Allergies  Penicillins  Home Medications   Prior to Admission medications   Not on File   BP 106/64  Pulse 60  Temp(Src) 98.8 F (37.1 C) (Oral)  Resp 18  Ht 5\' 2"  (1.575 m)  Wt 160 lb (72.576 kg)  BMI 29.26 kg/m2  SpO2 100%  LMP 02/11/2014 Physical Exam  Nursing note and vitals reviewed. Constitutional: She appears well-developed and well-nourished. No distress.  HENT:  Head: Normocephalic.  Eyes: Conjunctivae are normal.  Neck: Neck supple.  Cardiovascular: Normal rate, regular rhythm and normal heart sounds.   Pulmonary/Chest: Effort normal and breath sounds normal. No respiratory distress. She has no wheezes. She has no rales.  Abdominal: Soft. Bowel sounds are normal. She exhibits no distension and no mass. There is tenderness. There is no rebound and no guarding.  Suprapubic tenderness. No CVA tenderness bilaterally  Musculoskeletal: She exhibits no edema.  Neurological: She is alert.  Skin: Skin is warm and dry.  Psychiatric: She has a normal mood and affect. Her behavior is normal.    ED Course  Procedures (including critical care time) Labs Review Labs Reviewed  CBC WITH DIFFERENTIAL  COMPREHENSIVE METABOLIC PANEL  LIPASE, BLOOD  URINALYSIS, ROUTINE W REFLEX MICROSCOPIC  POC URINE PREG, ED    Imaging Review Dg Abd Acute W/chest  03/15/2014   CLINICAL DATA:  Low abdominal pain, nausea  EXAM: ACUTE ABDOMEN SERIES (ABDOMEN 2  VIEW & CHEST 1 VIEW)  COMPARISON:  04/18/2011  FINDINGS: There is no evidence of dilated bowel loops or free intraperitoneal air. No radiopaque calculi or other significant radiographic abnormality is seen. Heart size and mediastinal contours are within normal limits. Both lungs are clear. Mild to moderate stool burden overall.  IMPRESSION:  Negative abdominal radiographs.  No acute cardiopulmonary disease.   Electronically Signed   By: Conchita Paris M.D.   On: 03/15/2014 16:03     EKG Interpretation None      MDM   Final diagnoses:  Abdominal pain, unspecified abdominal location   Pt with suprapubic pain, due for period yesterday. Concerned about being pregnant. Urine analysis and urine preg negative. Will do pelvic exam. toradol ordered for pain. Labs normal.   3:36 PM Nurse reported pt refused toradol and pelvic exam. waiting on xray.   Pt eloped.   Filed Vitals:   03/15/14 1300 03/15/14 1330 03/15/14 1400 03/15/14 1424  BP: 113/69 101/60 106/64 106/64  Pulse: 67 63 62 60  Temp:      TempSrc:      Resp: 16 16 16 18   Height:      Weight:      SpO2: 100% 100% 100% 100%       Florene Route , PA-C 03/16/14 0013

## 2014-03-16 NOTE — ED Provider Notes (Signed)
Medical screening examination/treatment/procedure(s) were performed by non-physician practitioner and as supervising physician I was immediately available for consultation/collaboration.   EKG Interpretation None        Ezequiel Essex, MD 03/16/14 [phone]

## 2014-08-10 ENCOUNTER — Encounter: Payer: Self-pay | Admitting: *Deleted

## 2014-12-25 ENCOUNTER — Emergency Department (HOSPITAL_COMMUNITY)
Admission: EM | Admit: 2014-12-25 | Discharge: 2014-12-26 | Disposition: A | Discharge status: Home / Self Care | Payer: PRIVATE HEALTH INSURANCE | Attending: Emergency Medicine | Admitting: Emergency Medicine

## 2014-12-25 ENCOUNTER — Encounter (HOSPITAL_COMMUNITY): Payer: Self-pay | Admitting: Emergency Medicine

## 2014-12-25 DIAGNOSIS — M545 Low back pain: Secondary | ICD-10-CM | POA: Insufficient documentation

## 2014-12-25 DIAGNOSIS — R110 Nausea: Secondary | ICD-10-CM | POA: Diagnosis not present

## 2014-12-25 DIAGNOSIS — M791 Myalgia: Secondary | ICD-10-CM | POA: Insufficient documentation

## 2014-12-25 DIAGNOSIS — Z3202 Encounter for pregnancy test, result negative: Secondary | ICD-10-CM | POA: Insufficient documentation

## 2014-12-25 DIAGNOSIS — Z880 Allergy status to penicillin: Secondary | ICD-10-CM | POA: Insufficient documentation

## 2014-12-25 DIAGNOSIS — K2970 Gastritis, unspecified, without bleeding: Secondary | ICD-10-CM | POA: Insufficient documentation

## 2014-12-25 DIAGNOSIS — R109 Unspecified abdominal pain: Secondary | ICD-10-CM | POA: Diagnosis present

## 2014-12-25 NOTE — ED Notes (Signed)
Pt reports lower back pain for 3 days, abdominal for 2 days and nausea since Monday. Pt denies urinary sx. A&O X4.

## 2014-12-26 LAB — URINALYSIS, ROUTINE W REFLEX MICROSCOPIC
Bilirubin Urine: NEGATIVE
Glucose, UA: NEGATIVE mg/dL
Hgb urine dipstick: NEGATIVE
KETONES UR: NEGATIVE mg/dL
Leukocytes, UA: NEGATIVE
NITRITE: NEGATIVE
PH: 7.5 (ref 5.0–8.0)
Protein, ur: NEGATIVE mg/dL
SPECIFIC GRAVITY, URINE: 1.024 (ref 1.005–1.030)
UROBILINOGEN UA: 1 mg/dL (ref 0.0–1.0)

## 2014-12-26 LAB — CBC WITH DIFFERENTIAL/PLATELET
BASOS ABS: 0 10*3/uL (ref 0.0–0.1)
BASOS PCT: 0 % (ref 0–1)
EOS PCT: 7 % — AB (ref 0–5)
Eosinophils Absolute: 0.6 10*3/uL (ref 0.0–0.7)
HEMATOCRIT: 36.6 % (ref 36.0–46.0)
Hemoglobin: 12.4 g/dL (ref 12.0–15.0)
LYMPHS PCT: 24 % (ref 12–46)
Lymphs Abs: 2.2 10*3/uL (ref 0.7–4.0)
MCH: 30.9 pg (ref 26.0–34.0)
MCHC: 33.9 g/dL (ref 30.0–36.0)
MCV: 91.3 fL (ref 78.0–100.0)
MONO ABS: 0.7 10*3/uL (ref 0.1–1.0)
MONOS PCT: 8 % (ref 3–12)
NEUTROS ABS: 5.6 10*3/uL (ref 1.7–7.7)
Neutrophils Relative %: 61 % (ref 43–77)
PLATELETS: 273 10*3/uL (ref 150–400)
RBC: 4.01 MIL/uL (ref 3.87–5.11)
RDW: 12.3 % (ref 11.5–15.5)
WBC: 9.1 10*3/uL (ref 4.0–10.5)

## 2014-12-26 LAB — COMPREHENSIVE METABOLIC PANEL
ALBUMIN: 3.7 g/dL (ref 3.5–5.0)
ALK PHOS: 59 U/L (ref 38–126)
ALT: 18 U/L (ref 14–54)
AST: 24 U/L (ref 15–41)
Anion gap: 8 (ref 5–15)
BUN: 11 mg/dL (ref 6–20)
CHLORIDE: 105 mmol/L (ref 101–111)
CO2: 25 mmol/L (ref 22–32)
Calcium: 9.4 mg/dL (ref 8.9–10.3)
Creatinine, Ser: 0.86 mg/dL (ref 0.44–1.00)
GFR calc non Af Amer: >60 mL/min (ref >60)
GLUCOSE: 89 mg/dL (ref 65–99)
Potassium: 4.2 mmol/L (ref 3.5–5.1)
Sodium: 138 mmol/L (ref 135–145)
TOTAL PROTEIN: 6.7 g/dL (ref 6.5–8.1)
Total Bilirubin: 0.7 mg/dL (ref 0.3–1.2)

## 2014-12-26 LAB — LIPASE, BLOOD: LIPASE: 31 U/L (ref 22–51)

## 2014-12-26 LAB — POC URINE PREG, ED: Preg Test, Ur: NEGATIVE

## 2014-12-26 MED ORDER — GI COCKTAIL ~~LOC~~
30.0000 mL | Freq: Once | ORAL | Status: AC
  Administered 2014-12-26: 30 mL via ORAL
  Filled 2014-12-26: qty 30

## 2014-12-26 MED ORDER — ONDANSETRON 4 MG PO TBDP: ORAL_TABLET | ORAL | Status: DC

## 2014-12-26 MED ORDER — LANSOPRAZOLE 30 MG PO CPDR: 30.0000 mg | DELAYED_RELEASE_CAPSULE | Freq: Every day | ORAL | Status: DC

## 2014-12-26 NOTE — Discharge Instructions (Signed)

## 2014-12-26 NOTE — ED Notes (Signed)
MD at bedside. 

## 2014-12-26 NOTE — ED Provider Notes (Signed)
CSN: [id number]     Arrival date & time 12/25/14  2317 History  This chart was scribed for Julianne Rice, MD by Keaghan Pap, ED Scribe. This patient was seen in room D34C/D34C and patient care was started at 12:02 AM.     Chief Complaint  Patient presents with  . Back Pain  . Abdominal Pain   The history is provided by the patient. No language interpreter was used.   HPI Comments: Megan Harvey is a 39 y.o. female who presents to the Emergency Department complaining of back pain and abdominal pain onset one day ago. She reports that the pain was intermittent until 6 PM today and has since been constant.  She states that the back pain radiates to the upper region of her abdomen and is sharp and tight. She states that the pain worsens when she tries to get up from laying down. She reports associated nausea since Monday that worsens with eating. She denies vomiting, diarrhea, fever, leg swelling, leg pain, or chills. No vaginal bleeding or discharge. She denies any surgeries on her abdomen.   History reviewed. No pertinent past medical history. History reviewed. No pertinent past surgical history. No family history on file. History  Substance Use Topics  . Smoking status: Never Smoker   . Smokeless tobacco: Not on file  . Alcohol Use: No   OB History    No data available     Review of Systems  Constitutional: Negative for fever and chills.  Respiratory: Negative for cough and shortness of breath.   Cardiovascular: Negative for chest pain.  Gastrointestinal: Positive for nausea and abdominal pain. Negative for vomiting, diarrhea, constipation and blood in stool.  Genitourinary: Negative for hematuria, flank pain, vaginal bleeding, vaginal discharge and pelvic pain.  Musculoskeletal: Positive for myalgias and back pain. Negative for neck pain and neck stiffness.  Skin: Negative for rash and wound.  Neurological: Negative for dizziness, weakness, numbness and headaches.  All  other systems reviewed and are negative.     Allergies  Penicillins  Home Medications   Prior to Admission medications   Medication Sig Start Date End Date Taking? Authorizing Provider  lansoprazole (PREVACID) 30 MG capsule Take 1 capsule (30 mg total) by mouth daily at 12 noon. 12/26/14   Julianne Rice, MD  ondansetron (ZOFRAN ODT) 4 MG disintegrating tablet 4mg  ODT q4 hours prn nausea/vomit 12/26/14   Julianne Rice, MD   BP 123/96 mmHg  Pulse 71  Temp(Src) 98.4 F (36.9 C) (Oral)  Resp 16  SpO2 100%  LMP 11/20/2014 (Exact Date) Physical Exam  Constitutional: She is oriented to person, place, and time. She appears well-developed and well-nourished. No distress.  HENT:  Head: Normocephalic and atraumatic.  Mouth/Throat: Oropharynx is clear and moist.  Eyes: EOM are normal. Pupils are equal, round, and reactive to light.  Neck: Normal range of motion. Neck supple.  Cardiovascular: Normal rate and regular rhythm.   Pulmonary/Chest: Effort normal and breath sounds normal. No respiratory distress. She has no wheezes. She has no rales. She exhibits no tenderness.  Abdominal: Soft. Bowel sounds are normal. She exhibits no distension and no mass. There is tenderness (epigastric tenderness with palpation.). There is no rebound and no guarding.  Musculoskeletal: Normal range of motion. She exhibits tenderness (mild left superior lumbar paraspinal muscle tenderness to palpation. No midline thoracic or lumbar tenderness. No definite CVA tenderness bilaterally.). She exhibits no edema.  No calf swelling or tenderness.  Neurological: She is alert and oriented  to person, place, and time.  Moves all extremities without deficit. Sensation grossly intact.  Skin: Skin is warm and dry. No rash noted. No erythema.  Psychiatric: She has a normal mood and affect. Her behavior is normal.  Nursing note and vitals reviewed.   ED Course  Procedures (including critical care time) DIAGNOSTIC  STUDIES: Oxygen Saturation is 100% on room air, normal by my interpretation.    COORDINATION OF CARE: 12:05 AM-Discussed treatment plan which includes labs with pt at bedside and pt agreed to plan.   Labs Review Labs Reviewed  CBC WITH DIFFERENTIAL/PLATELET - Abnormal; Notable for the following:    Eosinophils Relative 7 (*)    All other components within normal limits  URINALYSIS, ROUTINE W REFLEX MICROSCOPIC - Abnormal; Notable for the following:    APPearance CLOUDY (*)    All other components within normal limits  COMPREHENSIVE METABOLIC PANEL  LIPASE, BLOOD  POC URINE PREG, ED    Imaging Review No results found.   EKG Interpretation None      MDM   Final diagnoses:  Gastritis    I personally performed the services described in this documentation, which was scribed in my presence. The recorded information has been reviewed and is accurate.  Patient feels much better after GI cocktail. Labs within normal limits. Suspect gastritis. Will start on PPI and advise over-the-counter Mylanta and Maalox. Patient been given dietary recommendations to avoid spicy and acidic foods. Also advised to avoid NSAIDs. Patient to follow-up with [facility]rology should her symptoms persist. Return precautions have been given.   Julianne Rice, MD 12/26/14 [phone]

## 2015-02-21 ENCOUNTER — Encounter (HOSPITAL_COMMUNITY): Payer: Self-pay | Admitting: *Deleted

## 2015-02-21 ENCOUNTER — Inpatient Hospital Stay (HOSPITAL_COMMUNITY)
Admission: EM | Admit: 2015-02-21 | Discharge: 2015-02-25 | DRG: 100 | Disposition: A | Discharge status: Home / Self Care | Payer: PRIVATE HEALTH INSURANCE | Attending: Internal Medicine | Admitting: Internal Medicine

## 2015-02-21 ENCOUNTER — Emergency Department (HOSPITAL_COMMUNITY): Payer: PRIVATE HEALTH INSURANCE

## 2015-02-21 DIAGNOSIS — R569 Unspecified convulsions: Secondary | ICD-10-CM | POA: Diagnosis present

## 2015-02-21 DIAGNOSIS — T50905A Adverse effect of unspecified drugs, medicaments and biological substances, initial encounter: Secondary | ICD-10-CM | POA: Diagnosis present

## 2015-02-21 DIAGNOSIS — I10 Essential (primary) hypertension: Secondary | ICD-10-CM | POA: Diagnosis present

## 2015-02-21 DIAGNOSIS — R0602 Shortness of breath: Secondary | ICD-10-CM

## 2015-02-21 DIAGNOSIS — G9340 Encephalopathy, unspecified: Secondary | ICD-10-CM | POA: Diagnosis present

## 2015-02-21 DIAGNOSIS — J9690 Respiratory failure, unspecified, unspecified whether with hypoxia or hypercapnia: Secondary | ICD-10-CM

## 2015-02-21 DIAGNOSIS — G40901 Epilepsy, unspecified, not intractable, with status epilepticus: Secondary | ICD-10-CM | POA: Diagnosis present

## 2015-02-21 DIAGNOSIS — Z820 Family history of epilepsy and other diseases of the nervous system: Secondary | ICD-10-CM

## 2015-02-21 DIAGNOSIS — G92 Toxic encephalopathy: Secondary | ICD-10-CM | POA: Diagnosis present

## 2015-02-21 DIAGNOSIS — E876 Hypokalemia: Secondary | ICD-10-CM | POA: Diagnosis present

## 2015-02-21 DIAGNOSIS — E871 Hypo-osmolality and hyponatremia: Secondary | ICD-10-CM | POA: Diagnosis not present

## 2015-02-21 DIAGNOSIS — G40401 Other generalized epilepsy and epileptic syndromes, not intractable, with status epilepticus: Secondary | ICD-10-CM | POA: Diagnosis present

## 2015-02-21 DIAGNOSIS — J9601 Acute respiratory failure with hypoxia: Secondary | ICD-10-CM | POA: Diagnosis present

## 2015-02-21 HISTORY — DX: Unspecified convulsions: R56.9

## 2015-02-21 LAB — RAPID URINE DRUG SCREEN, HOSP PERFORMED
Amphetamines: NOT DETECTED
Barbiturates: NOT DETECTED
Benzodiazepines: POSITIVE — AB
COCAINE: NOT DETECTED
OPIATES: NOT DETECTED
TETRAHYDROCANNABINOL: NOT DETECTED

## 2015-02-21 LAB — COMPREHENSIVE METABOLIC PANEL
ALT: 27 U/L (ref 14–54)
AST: 35 U/L (ref 15–41)
Albumin: 4.3 g/dL (ref 3.5–5.0)
Alkaline Phosphatase: 53 U/L (ref 38–126)
Anion gap: 6 (ref 5–15)
BILIRUBIN TOTAL: 0.7 mg/dL (ref 0.3–1.2)
BUN: 14 mg/dL (ref 6–20)
CHLORIDE: 110 mmol/L (ref 101–111)
CO2: 24 mmol/L (ref 22–32)
CREATININE: 0.81 mg/dL (ref 0.44–1.00)
Calcium: 9.4 mg/dL (ref 8.9–10.3)
GFR calc Af Amer: >60 mL/min (ref >60)
Glucose, Bld: 90 mg/dL (ref 65–99)
POTASSIUM: 3.6 mmol/L (ref 3.5–5.1)
Sodium: 140 mmol/L (ref 135–145)
Total Protein: 7.3 g/dL (ref 6.5–8.1)

## 2015-02-21 LAB — CBC WITH DIFFERENTIAL/PLATELET
BASOS ABS: 0 10*3/uL (ref 0.0–0.1)
Basophils Relative: 0 % (ref 0–1)
EOS ABS: 0.7 10*3/uL (ref 0.0–0.7)
Eosinophils Relative: 11 % — ABNORMAL HIGH (ref 0–5)
HEMATOCRIT: 37.7 % (ref 36.0–46.0)
HEMOGLOBIN: 12.7 g/dL (ref 12.0–15.0)
LYMPHS ABS: 1.3 10*3/uL (ref 0.7–4.0)
Lymphocytes Relative: 20 % (ref 12–46)
MCH: 30.6 pg (ref 26.0–34.0)
MCHC: 33.7 g/dL (ref 30.0–36.0)
MCV: 90.8 fL (ref 78.0–100.0)
Monocytes Absolute: 0.5 10*3/uL (ref 0.1–1.0)
Monocytes Relative: 8 % (ref 3–12)
Neutro Abs: 4.1 10*3/uL (ref 1.7–7.7)
Neutrophils Relative %: 61 % (ref 43–77)
Platelets: 250 10*3/uL (ref 150–400)
RBC: 4.15 MIL/uL (ref 3.87–5.11)
RDW: 12.2 % (ref 11.5–15.5)
WBC: 6.6 10*3/uL (ref 4.0–10.5)

## 2015-02-21 LAB — URINALYSIS, ROUTINE W REFLEX MICROSCOPIC
BILIRUBIN URINE: NEGATIVE
Glucose, UA: NEGATIVE mg/dL
Hgb urine dipstick: NEGATIVE
KETONES UR: NEGATIVE mg/dL
Leukocytes, UA: NEGATIVE
NITRITE: NEGATIVE
PROTEIN: NEGATIVE mg/dL
Specific Gravity, Urine: 1.01 (ref 1.005–1.030)
Urobilinogen, UA: 0.2 mg/dL (ref 0.0–1.0)
pH: 6 (ref 5.0–8.0)

## 2015-02-21 LAB — PREGNANCY, URINE: Preg Test, Ur: NEGATIVE

## 2015-02-21 LAB — CBG MONITORING, ED: Glucose-Capillary: 79 mg/dL (ref 65–99)

## 2015-02-21 LAB — ETHANOL: Alcohol, Ethyl (B): <5 mg/dL (ref <5)

## 2015-02-21 MED ORDER — SODIUM CHLORIDE 0.9 % IV BOLUS (SEPSIS)
1000.0000 mL | Freq: Once | INTRAVENOUS | Status: AC
  Administered 2015-02-21: 1000 mL via INTRAVENOUS

## 2015-02-21 MED ORDER — SODIUM CHLORIDE 0.9 % IV SOLN
INTRAVENOUS | Status: DC
  Administered 2015-02-21: 21:00:00 via INTRAVENOUS

## 2015-02-21 MED ORDER — LEVETIRACETAM 250 MG PO TABS
250.0000 mg | ORAL_TABLET | Freq: Once | ORAL | Status: AC
  Administered 2015-02-21: 250 mg via ORAL
  Filled 2015-02-21: qty 1

## 2015-02-21 MED ORDER — ONDANSETRON HCL 4 MG PO TABS: 4.0000 mg | ORAL_TABLET | Freq: Four times a day (QID) | ORAL | Status: DC | PRN

## 2015-02-21 MED ORDER — ONDANSETRON HCL 4 MG/2ML IJ SOLN
4.0000 mg | Freq: Four times a day (QID) | INTRAMUSCULAR | Status: DC | PRN
  Administered 2015-02-22: 4 mg via INTRAVENOUS
  Filled 2015-02-21: qty 2

## 2015-02-21 MED ORDER — ENOXAPARIN SODIUM 40 MG/0.4ML ~~LOC~~ SOLN
40.0000 mg | SUBCUTANEOUS | Status: DC
  Administered 2015-02-21: 40 mg via SUBCUTANEOUS
  Filled 2015-02-21: qty 0.4

## 2015-02-21 MED ORDER — LORAZEPAM 2 MG/ML IJ SOLN
0.5000 mg | INTRAMUSCULAR | Status: DC | PRN
  Filled 2015-02-21 (×2): qty 1

## 2015-02-21 NOTE — ED Provider Notes (Signed)
CSN: [id number]     Arrival date & time 02/21/15  1336 History   First MD Initiated Contact with Patient 02/21/15 1341     Chief Complaint  Patient presents with  . Seizures     (Consider location/radiation/quality/duration/timing/severity/associated sxs/prior Treatment) Patient is a 39 y.o. female presenting with seizures.  Seizures .... Level V caveat for urgent need for intervention.  Patient was standing in church when she slumped to the floor. Her husband assisted her to stand up and then she had a full tonic-clonic seizure. EMS was notified and she was transported to the emergency department. Remote history of seizure. No alcohol or drugs. No chronic health problems. No new medications.  History reviewed. No pertinent past medical history. History reviewed. No pertinent past surgical history. No family history on file. History  Substance Use Topics  . Smoking status: Not on file  . Smokeless tobacco: Not on file  . Alcohol Use: Not on file   OB History    No data available     Review of Systems  Unable to perform ROS: Acuity of condition  Neurological: Positive for seizures.      Allergies  Bee venom and Penicillins  Home Medications   Prior to Admission medications   Not on File   BP 126/81 mmHg  Pulse 79  Resp 30  SpO2 100%  LMP  (LMP Unknown) Physical Exam  Constitutional:  Patient is somnolent with eyes rolled in the back of her head  HENT:  Head: Normocephalic and atraumatic.  Eyes: Conjunctivae are normal. Pupils are equal, round, and reactive to light.  Neck: Normal range of motion. Neck supple.  Cardiovascular: Normal rate and regular rhythm.   Pulmonary/Chest: Effort normal and breath sounds normal.  Abdominal: Soft. Bowel sounds are normal.  Musculoskeletal:  Unable  Neurological:  Unable  Skin: Skin is warm and dry.  Psychiatric:  Unable  Nursing note and vitals reviewed.   ED Course  Procedures (including critical care time) Labs  Review Labs Reviewed  CBC WITH DIFFERENTIAL/PLATELET - Abnormal; Notable for the following:    Eosinophils Relative 11 (*)    All other components within normal limits  COMPREHENSIVE METABOLIC PANEL  ETHANOL  PREGNANCY, URINE  URINALYSIS, ROUTINE W REFLEX MICROSCOPIC (NOT AT [facility])  URINE RAPID DRUG SCREEN, HOSP PERFORMED  CBG MONITORING, ED    Imaging Review Ct Head Wo Contrast  02/21/2015   CLINICAL DATA:  Seizure.  EXAM: CT HEAD WITHOUT CONTRAST  TECHNIQUE: Contiguous axial images were obtained from the base of the skull through the vertex without intravenous contrast.  COMPARISON:  None.  FINDINGS: No evidence of intracranial hemorrhage, brain edema, or other signs of acute infarction. No evidence of intracranial mass lesion or mass effect.  No abnormal extraaxial fluid collections identified. Ventricles are normal in size. No skull abnormality identified.  IMPRESSION: Negative noncontrast head CT.   Electronically Signed   By: Earle Gell M.D.   On: 02/21/2015 15:23     EKG Interpretation   Date/Time:  Sunday February 21 2015 13:44:46 EDT Ventricular Rate:  79 PR Interval:  214 QRS Duration: 78 QT Interval:  374 QTC Calculation: 429 R Axis:   46 Text Interpretation:  Sinus rhythm Prolonged PR interval Confirmed by Lacinda Axon   MD,  ([id number]) on 02/21/2015 2:47:35 PM     CRITICAL CARE Performed by: Nat Christen  ?  Total critical care time: 30  Critical care time was exclusive of separately billable procedures and treating other patients.  Critical care was necessary to treat or prevent imminent or life-threatening deterioration.  Critical care was time spent personally by me on the following activities: development of treatment plan with patient and/or surrogate as well as nursing, discussions with consultants, evaluation of patient's response to treatment, examination of patient, obtaining history from patient or surrogate, ordering and performing treatments and interventions,  ordering and review of laboratory studies, ordering and review of radiographic studies, pulse oximetry and re-evaluation of patient's condition. MDM   Final diagnoses:  Seizure  Multiple rechecks by examiner.  Discussed findings with husband.  Patient observed for several hours in the emergency department. She still complains of blurred vision. Discussed with neurologist. Admit to general medicine.    Nat Christen, MD 02/21/15 [phone]

## 2015-02-21 NOTE — ED Notes (Signed)
MD Cook at bedside. 

## 2015-02-21 NOTE — ED Notes (Signed)
Pt is moving eyes and making contact with staff.

## 2015-02-21 NOTE — H&P (Signed)
[facility] History and Physical  Megan Harvey SWF:[id number] DOB: 07/27/1976 DOA: 02/21/2015  Referring physician: ER, Dr. Lacinda Axon PCP: No PCP Per Patient   Chief Complaint: Seizure  HPI: Megan Harvey is a 39 y.o. female  This is a 39 year old lady who possibly has a remote history of seizure. A friend, who was present with her, says that her brother does have a seizure disorder. Today while she was in church she apparently had a seizure. The description of said seizure is not available from anyone that I spoke with today. Her mother-in-law, who was present at the church and who is at the bedside now, did not see what exactly happened. In any event, her mentation has been altered and she has not had any witnessed seizures in the emergency room. During her visit in the ER so far, she has been moving eyes and making contact with staff members. There is no history of alcohol abuse or any history of chronic medications. She is now being admitted for further investigation.   Review of Systems:  Currently, the patient is somewhat drowsy and is unable to vocalize and talk with me and I am unable to obtain review of systems.  History reviewed. No pertinent past medical history. History reviewed. No pertinent past surgical history. Social History:  has no tobacco, alcohol, and drug history on file.  Allergies  Allergen Reactions  . Bee Venom Swelling  . Penicillins Other (See Comments)    Unknown reaction     Family history: Apparently, her brother has seizure disorder.   Prior to Admission medications   Not on File   Physical Exam: Filed Vitals:   02/21/15 1700 02/21/15 1730 02/21/15 1800 02/21/15 1900  BP: 94/73 113/76 108/83 126/81  Pulse: 74 65 74 79  Resp: 16 19 15 30   SpO2: 100% 100% 100% 100%    Wt Readings from Last 3 Encounters:  No data found for Wt    General:  Appears somewhat drowsy, has her eyes closed but then tries to open them when I say her name. She is not  febrile or toxic clinically. Eyes: PERRL, normal lids, irises & conjunctiva ENT: grossly normal hearing, lips & tongue Neck: no LAD, masses or thyromegaly Cardiovascular: RRR, no m/r/g. No LE edema. Telemetry: SR, no arrhythmias  Respiratory: CTA bilaterally, no w/r/r. Normal respiratory effort. Abdomen: soft, ntnd Skin: no rash or induration seen on limited exam Musculoskeletal: grossly normal tone BUE/BLE Psychiatric: Not examined Neurologic: grossly non-focal. both plantars are downgoing. She does not appear to have meningismus.           Labs on Admission:  Basic Metabolic Panel:  Recent Labs Lab 02/21/15 1408  NA 140  K 3.6  CL 110  CO2 24  GLUCOSE 90  BUN 14  CREATININE 0.81  CALCIUM 9.4   Liver Function Tests:  Recent Labs Lab 02/21/15 1408  AST 35  ALT 27  ALKPHOS 53  BILITOT 0.7  PROT 7.3  ALBUMIN 4.3   No results for input(s): LIPASE, AMYLASE in the last 168 hours. No results for input(s): AMMONIA in the last 168 hours. CBC:  Recent Labs Lab 02/21/15 1408  WBC 6.6  NEUTROABS 4.1  HGB 12.7  HCT 37.7  MCV 90.8  PLT 250   Cardiac Enzymes: No results for input(s): CKTOTAL, CKMB, CKMBINDEX, TROPONINI in the last 168 hours.  BNP (last 3 results) No results for input(s): BNP in the last 8760 hours.  ProBNP (last 3 results) No results for  input(s): PROBNP in the last 8760 hours.  CBG:  Recent Labs Lab 02/21/15 1347  GLUCAP 79    Radiological Exams on Admission: Ct Head Wo Contrast  02/21/2015   CLINICAL DATA:  Seizure.  EXAM: CT HEAD WITHOUT CONTRAST  TECHNIQUE: Contiguous axial images were obtained from the base of the skull through the vertex without intravenous contrast.  COMPARISON:  None.  FINDINGS: No evidence of intracranial hemorrhage, brain edema, or other signs of acute infarction. No evidence of intracranial mass lesion or mass effect.  No abnormal extraaxial fluid collections identified. Ventricles are normal in size. No skull  abnormality identified.  IMPRESSION: Negative noncontrast head CT.   Electronically Signed   By: Earle Gell M.D.   On: 02/21/2015 15:23      Assessment/Plan   1. Possible seizure. The diagnosis is based on history obtained from EMS and members of the church. I cannot be entirely clear of the description of said seizure. I will order MRI brain scan and will request neurology consultation. I will order EEG also. She can have intravenous Ativan if another event occurs.  Further recommendations will depend on patient's hospital progress.  Code Status: Full code.   DVT Prophylaxis: Lovenox.  Family Communication: I discussed the plan with the patient's mother-in-law at the bedside.   Disposition Plan: Home when medically stable  Time spent: 60 minutes.  Doree Albee [facility] Pager [phone].

## 2015-02-21 NOTE — ED Notes (Signed)
Lab at bedside

## 2015-02-21 NOTE — ED Notes (Signed)
Pt comes in by EMS from church for seizure. Pt had seizure in route with EMS. Pt was given 2 mg Ativan en route. Pt is not alert on arrival. MD Lacinda Axon at bedside.

## 2015-02-21 NOTE — ED Notes (Signed)
CBG 96. 

## 2015-02-21 NOTE — ED Notes (Signed)
Patient with c/o central chest pressure. Vital signs stable. MD made aware of complaints.

## 2015-02-22 ENCOUNTER — Inpatient Hospital Stay (HOSPITAL_COMMUNITY): Payer: PRIVATE HEALTH INSURANCE

## 2015-02-22 ENCOUNTER — Encounter (HOSPITAL_COMMUNITY): Payer: Self-pay

## 2015-02-22 ENCOUNTER — Inpatient Hospital Stay (HOSPITAL_COMMUNITY)
Admit: 2015-02-22 | Discharge: 2015-02-22 | Disposition: A | Discharge status: Home / Self Care | Payer: PRIVATE HEALTH INSURANCE | Attending: Internal Medicine | Admitting: Internal Medicine

## 2015-02-22 LAB — GLUCOSE, CAPILLARY
GLUCOSE-CAPILLARY: 66 mg/dL (ref 65–99)
GLUCOSE-CAPILLARY: 116 mg/dL — AB (ref 65–99)
Glucose-Capillary: 160 mg/dL — ABNORMAL HIGH (ref 65–99)
Glucose-Capillary: 80 mg/dL (ref 65–99)
Glucose-Capillary: 67 mg/dL (ref 65–99)
Glucose-Capillary: 165 mg/dL — ABNORMAL HIGH (ref 65–99)
Glucose-Capillary: 159 mg/dL — ABNORMAL HIGH (ref 65–99)

## 2015-02-22 LAB — BLOOD GAS, ARTERIAL
Acid-base deficit: 2.7 mmol/L — ABNORMAL HIGH (ref 0.0–2.0)
Bicarbonate: 20.6 mEq/L (ref 20.0–24.0)
DRAWN BY: 25788
FIO2: 40 %
MECHVT: 450 mL
O2 Saturation: 99.3 %
PEEP: 5 cmH2O
PO2 ART: 181 mmHg — AB (ref 80.0–100.0)
Patient temperature: 37
RATE: 14 resp/min
TCO2: 18.3 mmol/L (ref 0–100)
pCO2 arterial: 29.7 mmHg — ABNORMAL LOW (ref 35.0–45.0)
pH, Arterial: 7.455 — ABNORMAL HIGH (ref 7.350–7.450)

## 2015-02-22 LAB — MRSA PCR SCREENING: MRSA by PCR: INVALID — AB

## 2015-02-22 LAB — CBC
HEMATOCRIT: 33.1 % — AB (ref 36.0–46.0)
Hemoglobin: 11.2 g/dL — ABNORMAL LOW (ref 12.0–15.0)
MCH: 31.2 pg (ref 26.0–34.0)
MCHC: 33.8 g/dL (ref 30.0–36.0)
MCV: 92.2 fL (ref 78.0–100.0)
Platelets: 220 10*3/uL (ref 150–400)
RBC: 3.59 MIL/uL — AB (ref 3.87–5.11)
RDW: 12.2 % (ref 11.5–15.5)
WBC: 5.3 10*3/uL (ref 4.0–10.5)

## 2015-02-22 LAB — URINALYSIS, ROUTINE W REFLEX MICROSCOPIC
Bilirubin Urine: NEGATIVE
Glucose, UA: 500 mg/dL — AB
HGB URINE DIPSTICK: NEGATIVE
Ketones, ur: NEGATIVE mg/dL
Leukocytes, UA: NEGATIVE
NITRITE: NEGATIVE
PH: 6 (ref 5.0–8.0)
PROTEIN: NEGATIVE mg/dL
Specific Gravity, Urine: 1.01 (ref 1.005–1.030)
UROBILINOGEN UA: 0.2 mg/dL (ref 0.0–1.0)

## 2015-02-22 LAB — COMPREHENSIVE METABOLIC PANEL
ALBUMIN: 3.4 g/dL — AB (ref 3.5–5.0)
ALT: 21 U/L (ref 14–54)
ANION GAP: 4 — AB (ref 5–15)
AST: 27 U/L (ref 15–41)
Alkaline Phosphatase: 47 U/L (ref 38–126)
BUN: 11 mg/dL (ref 6–20)
CO2: 24 mmol/L (ref 22–32)
CREATININE: 0.64 mg/dL (ref 0.44–1.00)
Calcium: 8.4 mg/dL — ABNORMAL LOW (ref 8.9–10.3)
Chloride: 110 mmol/L (ref 101–111)
GFR calc Af Amer: >60 mL/min (ref >60)
Glucose, Bld: 92 mg/dL (ref 65–99)
Potassium: 3.8 mmol/L (ref 3.5–5.1)
Sodium: 138 mmol/L (ref 135–145)
Total Bilirubin: 1 mg/dL (ref 0.3–1.2)
Total Protein: 6 g/dL — ABNORMAL LOW (ref 6.5–8.1)

## 2015-02-22 LAB — MAGNESIUM: Magnesium: 1.8 mg/dL (ref 1.7–2.4)

## 2015-02-22 MED ORDER — SODIUM CHLORIDE 0.9 % IV SOLN
25.0000 ug/h | INTRAVENOUS | Status: DC
  Administered 2015-02-22 (×2): 50 ug/h via INTRAVENOUS
  Administered 2015-02-23: 350 ug/h via INTRAVENOUS
  Filled 2015-02-22: qty 50

## 2015-02-22 MED ORDER — CHLORHEXIDINE GLUCONATE 0.12 % MT SOLN
15.0000 mL | Freq: Two times a day (BID) | OROMUCOSAL | Status: DC
  Administered 2015-02-22 – 2015-02-23 (×2): 15 mL via OROMUCOSAL
  Filled 2015-02-22 (×2): qty 15

## 2015-02-22 MED ORDER — DEXTROSE-NACL 5-0.45 % IV SOLN
INTRAVENOUS | Status: DC
  Administered 2015-02-22: 1000 mL via INTRAVENOUS

## 2015-02-22 MED ORDER — LIDOCAINE HCL (CARDIAC) 20 MG/ML IV SOLN
INTRAVENOUS | Status: AC
  Filled 2015-02-22: qty 5

## 2015-02-22 MED ORDER — ACETAMINOPHEN 325 MG PO TABS
650.0000 mg | ORAL_TABLET | Freq: Four times a day (QID) | ORAL | Status: DC | PRN
  Administered 2015-02-22: 650 mg via ORAL
  Filled 2015-02-22: qty 2

## 2015-02-22 MED ORDER — ROCURONIUM BROMIDE 50 MG/5ML IV SOLN
INTRAVENOUS | Status: AC
  Administered 2015-02-22: 50 mg
  Filled 2015-02-22: qty 2

## 2015-02-22 MED ORDER — MIDAZOLAM HCL 2 MG/2ML IJ SOLN
2.0000 mg | INTRAMUSCULAR | Status: AC | PRN
  Administered 2015-02-22 (×3): 2 mg via INTRAVENOUS
  Filled 2015-02-22 (×3): qty 2

## 2015-02-22 MED ORDER — FENTANYL CITRATE (PF) 100 MCG/2ML IJ SOLN: 100.0000 ug | INTRAMUSCULAR | Status: DC | PRN

## 2015-02-22 MED ORDER — DEXTROSE 50 % IV SOLN
INTRAVENOUS | Status: AC
  Administered 2015-02-22: 50 mL
  Filled 2015-02-22: qty 50

## 2015-02-22 MED ORDER — CETYLPYRIDINIUM CHLORIDE 0.05 % MT LIQD
7.0000 mL | Freq: Four times a day (QID) | OROMUCOSAL | Status: DC
  Administered 2015-02-22 – 2015-02-24 (×5): 7 mL via OROMUCOSAL

## 2015-02-22 MED ORDER — FENTANYL CITRATE (PF) 100 MCG/2ML IJ SOLN: 50.0000 ug | Freq: Once | INTRAMUSCULAR | Status: DC

## 2015-02-22 MED ORDER — FENTANYL BOLUS VIA INFUSION
50.0000 ug | INTRAVENOUS | Status: DC | PRN
  Filled 2015-02-22: qty 50

## 2015-02-22 MED ORDER — MIDAZOLAM HCL 2 MG/2ML IJ SOLN
2.0000 mg | INTRAMUSCULAR | Status: DC | PRN
  Administered 2015-02-22 – 2015-02-23 (×6): 2 mg via INTRAVENOUS
  Filled 2015-02-22 (×7): qty 2

## 2015-02-22 MED ORDER — SUCCINYLCHOLINE CHLORIDE 20 MG/ML IJ SOLN
INTRAMUSCULAR | Status: AC
  Filled 2015-02-22: qty 1

## 2015-02-22 MED ORDER — PANTOPRAZOLE SODIUM 40 MG IV SOLR
40.0000 mg | Freq: Every day | INTRAVENOUS | Status: DC
  Administered 2015-02-22 – 2015-02-25 (×4): 40 mg via INTRAVENOUS
  Filled 2015-02-22 (×4): qty 40

## 2015-02-22 MED ORDER — FENTANYL CITRATE (PF) 100 MCG/2ML IJ SOLN
100.0000 ug | INTRAMUSCULAR | Status: AC | PRN
  Administered 2015-02-22 (×3): 100 ug via INTRAVENOUS
  Filled 2015-02-22 (×3): qty 2

## 2015-02-22 MED ORDER — ETOMIDATE 2 MG/ML IV SOLN
INTRAVENOUS | Status: AC
  Administered 2015-02-22: 8 mg
  Filled 2015-02-22: qty 20

## 2015-02-22 MED ORDER — LEVETIRACETAM IN NACL 1000 MG/100ML IV SOLN
1000.0000 mg | Freq: Two times a day (BID) | INTRAVENOUS | Status: DC
  Administered 2015-02-22 – 2015-02-24 (×6): 1000 mg via INTRAVENOUS
  Filled 2015-02-22 (×7): qty 100

## 2015-02-22 NOTE — Progress Notes (Signed)
Patient sitting up in bed at intervals. Medicated as needed for sedation. Family at bedside.

## 2015-02-22 NOTE — Consult Note (Signed)
Full note to follow she has seizures and required ventilator due to prolonged seizure activity.  She will remain on vent at least overnight and reevaluate in AM after we see if seizures are controlled

## 2015-02-22 NOTE — Progress Notes (Signed)
EEG Completed; Results Pending  

## 2015-02-22 NOTE — Progress Notes (Signed)
Pt was transferred to icu r/t seizure activity patient sedated on 300 unit and intubated pt needed more critical care family notified, rapid response team called pt was stable on way to icu, vitals stable, proper documentation completed per protocol

## 2015-02-22 NOTE — Procedures (Signed)
Intubation Procedure Note Megan Harvey [id number] Sep 07, 1975  Procedure: Intubation Indications: Airway protection and maintenance  Procedure Details Consent: Risks of procedure as well as the alternatives and risks of each were explained to the (patient/caregiver).  Consent for procedure obtained. Time Out: Verified patient identification, verified procedure, site/side was marked, verified correct patient position, special equipment/implants available, medications/allergies/relevent history reviewed, required imaging and test results available.  Performed  Maximum sterile technique was used including antiseptics, gloves, gown and hand hygiene.  MAC and 4    Evaluation Hemodynamic Status: BP stable throughout; O2 sats: stable throughout Patient's Current Condition: stable Complications: No apparent complications Patient did tolerate procedure well. Chest X-ray ordered to verify placement.  CXR: pending.   Elsie Stain 02/22/2015
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
## 2015-02-22 NOTE — Progress Notes (Signed)
Dr. Merlene Laughter was called and he was already aware of patient consult.

## 2015-02-22 NOTE — Progress Notes (Signed)
Notified Dr. Luan Pulling of consult via telephone.

## 2015-02-22 NOTE — ED Provider Notes (Signed)
Emergency department was called for rapid response on the floor for patient was unresponsive, hypoxic and seizing.  Myself along with ER nursing with intubation equipment when up to the patient's room. Patient was found to be lethargic, patient has had generalized seizures prior to arrival, Ativan was given August patient still lethargic despite only 1 mg. Internist at bedside as well for discussion of the patient. Respiratory bagging the patient.  Observe the patient closely in hopes of increased neural response however minimal improvement. Patient still not protecting airway. Decision made to intubate patient in transfer to the ICU.  With the assistance of nurse anesthetist I intubated the patient without difficulty, Ativan given afterwards until sedatives arrive. On exam prior to intubation patient lethargic, pupils 2 mm minimal responsive bilateral, patient barely moving all extremities with mild seizure-like activity generalized. Respiratory equal breath sounds, no distress.  INTUBATION Performed by: Mariea Clonts  Required items: required blood products, implants, devices, and special equipment available Patient identity confirmed: provided demographic data and hospital-assigned identification number Time out: Immediately prior to procedure a "time out" was called to verify the correct patient, procedure, equipment, support staff.  Indications: altered, airway protective Intubation method: direct Preoxygenation: BVM Sedatives: 8 mg Etomidate Paralytic: rocuronium Tube Size: 7.5 cuffed  Post-procedure assessment: chest rise and ETCO2 monitor Breath sounds: equal and absent over the epigastrium Tube secured with: ETT holder Chest x-ray ordered.   Patient tolerated the procedure well with no immediate complications.  Filed Vitals:   02/22/15 1400  BP: 135/78  Pulse: 99  Temp:   Resp: 18   ,  Mariea Clonts, MD 02/22/15 1537

## 2015-02-22 NOTE — Consult Note (Signed)
[place] A. Merlene Laughter, MD     [email or web address]          Megan Harvey is an 39 y.o. female.   ASSESSMENT/PLAN:  1. Back to back seizures within a few hours required intubation. This is seems to be status epilepticus which is resolving. Etiology unclear.  2. Encephalopathy due to the above and also due to medications.  RECOMMENDATION: EEG. MRI when available. Continue with Keppra thousand milligrams twice a day. Seizure precautions including no driving.   The patient is a 39 year old black female who presented to the hospital with what appears to be seizure. The seizure appears to have occurred at church on the patient's standing. It appears that she had a full on tonoclonic seizure. She was admitted to the hospital for further evaluation. It appears the patient had another seizure a few hours after being hospitalized. This was a grand mal seizure and associated with some respiratory issues. She was intubated for airway protection and sent to the ICU. She has been sedated. She was loaded with Keppra. She has been on medication for the ventilator. She has been quite encephalopathic but apparently has been improving.  GENERAL: Intubated and sedated.  HEENT: Supple. Atraumatic normocephalic.   ABDOMEN: soft  EXTREMITIES: No edema   BACK: Normal.  SKIN: Normal by inspection.    MENTAL STATUS: She lays in bed with eyes closed. Still nice to verbal command. She follows both midline and appendicular commands.  CRANIAL NERVES: Pupils are equal, round and reactive to light; extra ocular movements are full, there is no significant nystagmus; visual fields - Limited but seemed to respond to direct strength bilaterally; upper and lower facial muscles are normal in strength and symmetric, there is no flattening of the nasolabial folds. Gag reflexes and corneal reflexes are present.  MOTOR: Bulk and tone are normal throughout. She has antigravity strength in the upper  extremities and wiggles her toes bilaterally.  COORDINATION: No evidence of tremors, parkinsonism or dysmetria.  REFLEXES: Deep tendon reflexes are symmetrical and normal.    SENSATION: Unreliable but she seems a grimaces to deep painful stimuli on both sides.     Blood pressure 133/85, pulse 122, temperature 98 F (36.7 C), temperature source Axillary, resp. rate 18, height _0  (1.6 m), weight 73.3 kg (161 lb 9.6 oz), SpO2 100 %.  Past Medical History  Diagnosis Date  . Seizures     History reviewed. No pertinent past surgical history.  History reviewed. No pertinent family history.  Social History:  reports that she has never smoked. She has never used smokeless tobacco. She reports that she does not drink alcohol or use illicit drugs.  Allergies:  Allergies  Allergen Reactions  . Bee Venom Swelling  . Penicillins Other (See Comments)    Unknown reaction    Medications: Prior to Admission medications   Not on File    Scheduled Meds: . antiseptic oral rinse  7 mL Mouth Rinse QID  . chlorhexidine  15 mL Mouth Rinse BID  . fentaNYL (SUBLIMAZE) injection  50 mcg Intravenous Once  . levETIRAcetam  1,000 mg Intravenous Q12H  . lidocaine (cardiac) 100 mg/53m      . pantoprazole (PROTONIX) IV  40 mg Intravenous Daily  . succinylcholine       Continuous Infusions: . dextrose 5 % and 0.45% NaCl 1,000 mL (02/22/15 1526)  . fentaNYL infusion INTRAVENOUS 75 mcg/hr (02/22/15 1317)   PRN Meds:.fentaNYL, fentaNYL (SUBLIMAZE) injection, midazolam, [DISCONTINUED] ondansetron **OR** ondansetron (ZOFRAN) IV  Results for orders placed or performed during the hospital encounter of 02/21/15 (from the past 48 hour(s))  POC CBG, ED     Status: None   Collection Time: 02/21/15  1:47 PM  Result Value Ref Range   Glucose-Capillary 79 65 - 99 mg/dL  CBC with Differential     Status: Abnormal   Collection Time: 02/21/15  2:08 PM  Result Value Ref Range   WBC 6.6 4.0 - 10.5  K/uL   RBC 4.15 3.87 - 5.11 MIL/uL   Hemoglobin 12.7 12.0 - 15.0 g/dL   HCT 37.7 36.0 - 46.0 %   MCV 90.8 78.0 - 100.0 fL   MCH 30.6 26.0 - 34.0 pg   MCHC 33.7 30.0 - 36.0 g/dL   RDW 12.2 11.5 - 15.5 %   Platelets 250 150 - 400 K/uL   Neutrophils Relative % 61 43 - 77 %   Neutro Abs 4.1 1.7 - 7.7 K/uL   Lymphocytes Relative 20 12 - 46 %   Lymphs Abs 1.3 0.7 - 4.0 K/uL   Monocytes Relative 8 3 - 12 %   Monocytes Absolute 0.5 0.1 - 1.0 K/uL   Eosinophils Relative 11 (H) 0 - 5 %   Eosinophils Absolute 0.7 0.0 - 0.7 K/uL   Basophils Relative 0 0 - 1 %   Basophils Absolute 0.0 0.0 - 0.1 K/uL  Comprehensive metabolic panel     Status: None   Collection Time: 02/21/15  2:08 PM  Result Value Ref Range   Sodium 140 135 - 145 mmol/L   Potassium 3.6 3.5 - 5.1 mmol/L   Chloride 110 101 - 111 mmol/L   CO2 24 22 - 32 mmol/L   Glucose, Bld 90 65 - 99 mg/dL   BUN 14 6 - 20 mg/dL   Creatinine, Ser 0.81 0.44 - 1.00 mg/dL   Calcium 9.4 8.9 - 10.3 mg/dL   Total Protein 7.3 6.5 - 8.1 g/dL   Albumin 4.3 3.5 - 5.0 g/dL   AST 35 15 - 41 U/L   ALT 27 14 - 54 U/L   Alkaline Phosphatase 53 38 - 126 U/L   Total Bilirubin 0.7 0.3 - 1.2 mg/dL   GFR calc non Af Amer >60 >60 mL/min   GFR calc Af Amer >60 >60 mL/min    Comment: (NOTE) The eGFR has been calculated using the CKD EPI equation. This calculation has not been validated in all clinical situations. eGFR's persistently <60 mL/min signify possible Chronic Kidney Disease.    Anion gap 6 5 - 15  Ethanol     Status: None   Collection Time: 02/21/15  2:08 PM  Result Value Ref Range   Alcohol, Ethyl (B) <5 <5 mg/dL    Comment:        LOWEST DETECTABLE LIMIT FOR SERUM ALCOHOL IS 5 mg/dL FOR MEDICAL PURPOSES ONLY   Pregnancy, urine     Status: None   Collection Time: 02/21/15  9:27 PM  Result Value Ref Range   Preg Test, Ur NEGATIVE NEGATIVE    Comment:        THE SENSITIVITY OF THIS METHODOLOGY IS >20 mIU/mL.   Urinalysis, Routine w  reflex microscopic (not at [facility])     Status: None   Collection Time: 02/21/15  9:27 PM  Result Value Ref Range   Color, Urine YELLOW YELLOW   APPearance CLEAR CLEAR   Specific Gravity, Urine 1.010 1.005 - 1.030   pH 6.0 5.0 - 8.0   Glucose,  UA NEGATIVE NEGATIVE mg/dL   Hgb urine dipstick NEGATIVE NEGATIVE   Bilirubin Urine NEGATIVE NEGATIVE   Ketones, ur NEGATIVE NEGATIVE mg/dL   Protein, ur NEGATIVE NEGATIVE mg/dL   Urobilinogen, UA 0.2 0.0 - 1.0 mg/dL   Nitrite NEGATIVE NEGATIVE   Leukocytes, UA NEGATIVE NEGATIVE    Comment: MICROSCOPIC NOT DONE ON URINES WITH NEGATIVE PROTEIN, BLOOD, LEUKOCYTES, NITRITE, OR GLUCOSE <1000 mg/dL.  Urine rapid drug screen (hosp performed)     Status: Abnormal   Collection Time: 02/21/15  9:27 PM  Result Value Ref Range   Opiates NONE DETECTED NONE DETECTED   Cocaine NONE DETECTED NONE DETECTED   Benzodiazepines POSITIVE (A) NONE DETECTED   Amphetamines NONE DETECTED NONE DETECTED   Tetrahydrocannabinol NONE DETECTED NONE DETECTED   Barbiturates NONE DETECTED NONE DETECTED    Comment:        DRUG SCREEN FOR MEDICAL PURPOSES ONLY.  IF CONFIRMATION IS NEEDED FOR ANY PURPOSE, NOTIFY LAB WITHIN 5 DAYS.        LOWEST DETECTABLE LIMITS FOR URINE DRUG SCREEN Drug Class       Cutoff (ng/mL) Amphetamine      1000 Barbiturate      200 Benzodiazepine   417 Tricyclics       408 Opiates          300 Cocaine          300 THC              50   CBC     Status: Abnormal   Collection Time: 02/22/15  5:23 AM  Result Value Ref Range   WBC 5.3 4.0 - 10.5 K/uL   RBC 3.59 (L) 3.87 - 5.11 MIL/uL   Hemoglobin 11.2 (L) 12.0 - 15.0 g/dL   HCT 33.1 (L) 36.0 - 46.0 %   MCV 92.2 78.0 - 100.0 fL   MCH 31.2 26.0 - 34.0 pg   MCHC 33.8 30.0 - 36.0 g/dL   RDW 12.2 11.5 - 15.5 %   Platelets 220 150 - 400 K/uL  Comprehensive metabolic panel     Status: Abnormal   Collection Time: 02/22/15  5:23 AM  Result Value Ref Range   Sodium 138 135 - 145 mmol/L   Potassium  3.8 3.5 - 5.1 mmol/L   Chloride 110 101 - 111 mmol/L   CO2 24 22 - 32 mmol/L   Glucose, Bld 92 65 - 99 mg/dL   BUN 11 6 - 20 mg/dL   Creatinine, Ser 0.64 0.44 - 1.00 mg/dL   Calcium 8.4 (L) 8.9 - 10.3 mg/dL   Total Protein 6.0 (L) 6.5 - 8.1 g/dL   Albumin 3.4 (L) 3.5 - 5.0 g/dL   AST 27 15 - 41 U/L   ALT 21 14 - 54 U/L   Alkaline Phosphatase 47 38 - 126 U/L   Total Bilirubin 1.0 0.3 - 1.2 mg/dL   GFR calc non Af Amer >60 >60 mL/min   GFR calc Af Amer >60 >60 mL/min    Comment: (NOTE) The eGFR has been calculated using the CKD EPI equation. This calculation has not been validated in all clinical situations. eGFR's persistently <60 mL/min signify possible Chronic Kidney Disease.    Anion gap 4 (L) 5 - 15  Magnesium     Status: None   Collection Time: 02/22/15  5:23 AM  Result Value Ref Range   Magnesium 1.8 1.7 - 2.4 mg/dL  Glucose, capillary     Status: None   Collection  Time: 02/22/15  9:00 AM  Result Value Ref Range   Glucose-Capillary 66 65 - 99 mg/dL   Comment 1 Notify RN    Comment 2 Document in Chart   Glucose, capillary     Status: Abnormal   Collection Time: 02/22/15  9:08 AM  Result Value Ref Range   Glucose-Capillary 160 (H) 65 - 99 mg/dL   Comment 1 Notify RN    Comment 2 Document in Chart   Urinalysis, Routine w reflex microscopic (not at [facility])     Status: Abnormal   Collection Time: 02/22/15  9:33 AM  Result Value Ref Range   Color, Urine YELLOW YELLOW   APPearance CLEAR CLEAR   Specific Gravity, Urine 1.010 1.005 - 1.030   pH 6.0 5.0 - 8.0   Glucose, UA 500 (A) NEGATIVE mg/dL   Hgb urine dipstick NEGATIVE NEGATIVE   Bilirubin Urine NEGATIVE NEGATIVE   Ketones, ur NEGATIVE NEGATIVE mg/dL   Protein, ur NEGATIVE NEGATIVE mg/dL   Urobilinogen, UA 0.2 0.0 - 1.0 mg/dL   Nitrite NEGATIVE NEGATIVE   Leukocytes, UA NEGATIVE NEGATIVE    Comment: MICROSCOPIC NOT DONE ON URINES WITH NEGATIVE PROTEIN, BLOOD, LEUKOCYTES, NITRITE, OR GLUCOSE <1000 mg/dL.  MRSA PCR  Screening     Status: Abnormal   Collection Time: 02/22/15  9:34 AM  Result Value Ref Range   MRSA by PCR INVALID RESULTS, SPECIMEN SENT FOR CULTURE (A) NEGATIVE    Comment: Results Called to: HILTON L. AT 1245P ON [id number] BY THOMPSON S.        The GeneXpert MRSA Assay (FDA approved for NASAL specimens only), is one component of a comprehensive MRSA colonization surveillance program. It is not intended to diagnose MRSA infection nor to guide or monitor treatment for MRSA infections.   Draw ABG 1 hour after initiation of ventilator     Status: Abnormal   Collection Time: 02/22/15 10:15 AM  Result Value Ref Range   FIO2 40.00 %   Delivery systems VENTILATOR    Mode PRESSURE REGULATED VOLUME CONTROL    VT 450 mL   Rate 14 resp/min   Peep/cpap 5.0 cm H20   pH, Arterial 7.455 (H) 7.350 - 7.450   pCO2 arterial 29.7 (L) 35.0 - 45.0 mmHg   pO2, Arterial 181 (H) 80.0 - 100.0 mmHg   Bicarbonate 20.6 20.0 - 24.0 mEq/L   TCO2 18.3 0 - 100 mmol/L   Acid-base deficit 2.7 (H) 0.0 - 2.0 mmol/L   O2 Saturation 99.3 %   Patient temperature 37.0    Collection site RIGHT RADIAL    Drawn by [id number]    Sample type ARTERIAL    Allens test (pass/fail) PASS PASS  Glucose, capillary     Status: Abnormal   Collection Time: 02/22/15 11:21 AM  Result Value Ref Range   Glucose-Capillary 116 (H) 65 - 99 mg/dL   Comment 1 Notify RN    Comment 2 Document in Chart   Glucose, capillary     Status: None   Collection Time: 02/22/15  1:03 PM  Result Value Ref Range   Glucose-Capillary 80 65 - 99 mg/dL   Comment 1 Notify RN    Comment 2 Document in Chart   Glucose, capillary     Status: None   Collection Time: 02/22/15  2:49 PM  Result Value Ref Range   Glucose-Capillary 67 65 - 99 mg/dL  Glucose, capillary     Status: Abnormal   Collection Time: 02/22/15  4:59 PM  Result  Value Ref Range   Glucose-Capillary 165 (H) 65 - 99 mg/dL   Comment 1 Document in Chart    Comment 2 Repeat Test      Studies/Results: HEAD CT NORMAL      A. Merlene Laughter, M.D.  Diplomate, Tax adviser of Psychiatry and Neurology ( Neurology). 02/22/2015, 6:47 PM

## 2015-02-22 NOTE — Procedures (Addendum)
  [place] A. Merlene Laughter, MD     [email or web address]           HISTORY: The patient is a 39 year old female who presents with multiple seizures. She is currently intubated.   MEDICATIONS: Scheduled Meds: . antiseptic oral rinse  7 mL Mouth Rinse QID  . chlorhexidine  15 mL Mouth Rinse BID  . fentaNYL (SUBLIMAZE) injection  50 mcg Intravenous Once  . levETIRAcetam  1,000 mg Intravenous Q12H  . lidocaine (cardiac) 100 mg/35ml      . pantoprazole (PROTONIX) IV  40 mg Intravenous Daily  . succinylcholine       Continuous Infusions: . dextrose 5 % and 0.45% NaCl 1,000 mL (02/22/15 1526)  . fentaNYL infusion INTRAVENOUS 75 mcg/hr (02/22/15 1317)   PRN Meds:.fentaNYL, fentaNYL (SUBLIMAZE) injection, midazolam, [DISCONTINUED] ondansetron **OR** ondansetron (ZOFRAN) IV  Prior to Admission medications   Not on File      ANALYSIS: A 16 channel recording using standard 10 20 measurements is conducted for 20 minutes. There is a well formed posterior dominant rhythm of low voltage there gets approximately 15 hz activity. There is to observe in the frontal areas. There significant spindle ing seen throughout the recording which seems excessive than expected. Photic stimulation and hyperventilation are not carried out. There is no focal or lateral slowing. There is no epileptiform activity observed.    IMPRESSION: 1. This recording is essentially unremarkable other than for excessive spinning likely due to benzodiazepines. There is no epileptiform activity observed.        A. Merlene Laughter, M.D.  Diplomate, Tax adviser of Psychiatry and Neurology ( Neurology).

## 2015-02-23 ENCOUNTER — Inpatient Hospital Stay (HOSPITAL_COMMUNITY): Payer: PRIVATE HEALTH INSURANCE

## 2015-02-23 DIAGNOSIS — J9601 Acute respiratory failure with hypoxia: Secondary | ICD-10-CM | POA: Diagnosis present

## 2015-02-23 DIAGNOSIS — G9340 Encephalopathy, unspecified: Secondary | ICD-10-CM | POA: Diagnosis present

## 2015-02-23 LAB — BASIC METABOLIC PANEL
Anion gap: 7 (ref 5–15)
BUN: 5 mg/dL — ABNORMAL LOW (ref 6–20)
CHLORIDE: 100 mmol/L — AB (ref 101–111)
CO2: 24 mmol/L (ref 22–32)
Calcium: 8.2 mg/dL — ABNORMAL LOW (ref 8.9–10.3)
Creatinine, Ser: 0.65 mg/dL (ref 0.44–1.00)
GFR calc Af Amer: >60 mL/min (ref >60)
GFR calc non Af Amer: >60 mL/min (ref >60)
Glucose, Bld: 218 mg/dL — ABNORMAL HIGH (ref 65–99)
Potassium: 3.3 mmol/L — ABNORMAL LOW (ref 3.5–5.1)
Sodium: 131 mmol/L — ABNORMAL LOW (ref 135–145)

## 2015-02-23 LAB — BLOOD GAS, ARTERIAL
Acid-base deficit: 3.2 mmol/L — ABNORMAL HIGH (ref 0.0–2.0)
Bicarbonate: 20.3 mEq/L (ref 20.0–24.0)
Drawn by: 317771
FIO2: 0.4 %
O2 SAT: 99 %
PATIENT TEMPERATURE: 37
PEEP/CPAP: 5 cmH2O
PH ART: 7.435 (ref 7.350–7.450)
PO2 ART: 193 mmHg — AB (ref 80.0–100.0)
RATE: 14 resp/min
TCO2: 18.5 mmol/L (ref 0–100)
VT: 450 mL
pCO2 arterial: 30.8 mmHg — ABNORMAL LOW (ref 35.0–45.0)

## 2015-02-23 LAB — CBC
HCT: 32.8 % — ABNORMAL LOW (ref 36.0–46.0)
HEMOGLOBIN: 11.2 g/dL — AB (ref 12.0–15.0)
MCH: 31.1 pg (ref 26.0–34.0)
MCHC: 34.1 g/dL (ref 30.0–36.0)
MCV: 91.1 fL (ref 78.0–100.0)
Platelets: 218 10*3/uL (ref 150–400)
RBC: 3.6 MIL/uL — AB (ref 3.87–5.11)
RDW: 12.1 % (ref 11.5–15.5)
WBC: 14.1 10*3/uL — ABNORMAL HIGH (ref 4.0–10.5)

## 2015-02-23 LAB — GLUCOSE, CAPILLARY
GLUCOSE-CAPILLARY: 171 mg/dL — AB (ref 65–99)
GLUCOSE-CAPILLARY: 175 mg/dL — AB (ref 65–99)
GLUCOSE-CAPILLARY: 141 mg/dL — AB (ref 65–99)
Glucose-Capillary: 115 mg/dL — ABNORMAL HIGH (ref 65–99)
Glucose-Capillary: 155 mg/dL — ABNORMAL HIGH (ref 65–99)
Glucose-Capillary: 163 mg/dL — ABNORMAL HIGH (ref 65–99)

## 2015-02-23 LAB — MAGNESIUM: Magnesium: 1.5 mg/dL — ABNORMAL LOW (ref 1.7–2.4)

## 2015-02-23 MED ORDER — PHENOL 1.4 % MT LIQD
1.0000 | OROMUCOSAL | Status: DC | PRN
  Administered 2015-02-23 – 2015-02-24 (×3): 1 via OROMUCOSAL
  Filled 2015-02-23 (×2): qty 177

## 2015-02-23 MED ORDER — POTASSIUM CHLORIDE 10 MEQ/100ML IV SOLN
10.0000 meq | INTRAVENOUS | Status: AC
  Administered 2015-02-23 (×2): 10 meq via INTRAVENOUS
  Filled 2015-02-23: qty 100

## 2015-02-23 MED ORDER — FENTANYL CITRATE (PF) 2500 MCG/50ML IJ SOLN
INTRAMUSCULAR | Status: AC
Start: 2015-02-23 — End: 2015-02-23
  Filled 2015-02-23: qty 50

## 2015-02-23 MED ORDER — SODIUM CHLORIDE 0.9 % IV SOLN
INTRAVENOUS | Status: DC
  Administered 2015-02-23: 1000 mL via INTRAVENOUS

## 2015-02-23 MED ORDER — MAGNESIUM SULFATE 2 GM/50ML IV SOLN
2.0000 g | Freq: Once | INTRAVENOUS | Status: AC
  Administered 2015-02-23: 2 g via INTRAVENOUS
  Filled 2015-02-23: qty 50

## 2015-02-23 MED ORDER — ALBUTEROL SULFATE (2.5 MG/3ML) 0.083% IN NEBU
INHALATION_SOLUTION | RESPIRATORY_TRACT | Status: AC
  Administered 2015-02-23: 2.5 mg
  Filled 2015-02-23: qty 3

## 2015-02-23 MED ORDER — POTASSIUM CHLORIDE 10 MEQ/100ML IV SOLN
10.0000 meq | INTRAVENOUS | Status: DC
  Administered 2015-02-23 (×2): 10 meq via INTRAVENOUS
  Filled 2015-02-23 (×2): qty 100

## 2015-02-23 NOTE — Progress Notes (Signed)
Pt was extubated to 4 L [place] 99%  per Dr. Luan Pulling. Pt followed simple directions leak test positive. NIF -55, FVC 1L. Pt has a great cough and her lungs are clear. RT decreased O2 to 2 L [place] and her SATS are 99%.

## 2015-02-23 NOTE — Procedures (Signed)
Extubation Procedure Note  Patient Details:   Name: Megan Harvey DOB: 1975-10-17 MRN: [id number]   Airway Documentation:  Airway 7.5 mm (Active)  Secured at (cm) 23 cm 02/23/2015  9:34 AM  Measured From Lips 02/23/2015  9:34 AM  [place] 02/23/2015  8:14 AM  Secured By [employer] 02/23/2015  9:34 AM  Tube Holder Repositioned Yes 02/23/2015  9:34 AM  Cuff Pressure (cm H2O) 23 cm H2O 02/23/2015  9:34 AM  Site Condition Dry 02/23/2015  9:34 AM    Evaluation  O2 sats: stable throughout Complications: No apparent complications Patient did tolerate procedure well. Bilateral Breath Sounds: Clear Suctioning: Airway Yes  Elsie Stain 02/23/2015, 9:45 AM

## 2015-02-23 NOTE — Progress Notes (Signed)
Patient ID: Megan Harvey, female   DOB: [birth year], 38 y.o.   MRN: [id number]  Megan A. Merlene Laughter, MD     [email or web address]          Megan Harvey is an 39 y.o. female.   Assessment/Plan:  1. This is seems to be status epilepticus which is resolving. Etiology unclear.  2. Encephalopathy due to the above and also due to medications.  RECOMMENDATION: MRI when available. Continue with Keppra thousand milligrams twice a day. Seizure precautions including no driving.  The patient was extubated this morning at 9 AM. She has done fairly well throughout the day although this evening she complains of sore throat and some dyspnea. She is satting in the 90s on 2 L. Further treatment will be deferred to the hospitalist. No recurrent seizures or altered mental status episodes have been reported.   GENERAL: Extubated  HEENT: Supple. Atraumatic normocephalic.   ABDOMEN: soft  EXTREMITIES: No edema   BACK: Normal.  SKIN: Normal by inspection.   MENTAL STATUS: She is awake and alert. She follows commands and speaking simple sentences. She appears mildly dyspneic.  CRANIAL NERVES: Pupils are equal, round and reactive to light; extra ocular movements are full, there is no significant nystagmus; visual fields - Limited but seemed to respond to direct strength bilaterally; upper and lower facial muscles are normal in strength and symmetric, there is no flattening of the nasolabial folds. Gag reflexes and corneal reflexes are present.  MOTOR: Bulk and tone are normal throughout. She has antigravity strength in the upper extremities and legs.  COORDINATION: No evidence of tremors, parkinsonism or dysmetria.  REFLEXES: Deep tendon reflexes are symmetrical and normal.   SENSATION: Unreliable but she seems a grimaces to deep painful stimuli on both sides.     Objective: Vital signs in last 24 hours: Temp:  [98.2 F (36.8 C)-100 F (37.8 C)] 100 F (37.8 C) (07/12  1630) Pulse Rate:  [57-104] 81 (07/12 2000) Resp:  [4-25] 15 (07/12 2000) BP: (75-135)/(58-98) 107/67 mmHg (07/12 2000) SpO2:  [97 %-100 %] 100 % (07/12 2000) FiO2 (%):  [28 %-40 %] 28 % (07/12 1900) Weight:  [76.658 kg (169 lb)-77 kg (169 lb 12.1 oz)] 76.658 kg (169 lb) (07/12 1809)  Intake/Output from previous day: 07/11 0701 - 07/12 0700 In: 3648.5 [I.V.:2948.5; IV Piggyback:100] Out: 1400 [Urine:1400] Intake/Output this shift: Total I/O In: 240 [P.O.:240] Out: -  Nutritional status: Diet clear liquid Room service appropriate?: Yes; Fluid consistency:: Thin   Lab Results: Results for orders placed or performed during the hospital encounter of 02/21/15 (from the past 48 hour(s))  Pregnancy, urine     Status: None   Collection Time: 02/21/15  9:27 PM  Result Value Ref Range   Preg Test, Ur NEGATIVE NEGATIVE    Comment:        THE SENSITIVITY OF THIS METHODOLOGY IS >20 mIU/mL.   Urinalysis, Routine w reflex microscopic (not at [facility])     Status: None   Collection Time: 02/21/15  9:27 PM  Result Value Ref Range   Color, Urine YELLOW YELLOW   APPearance CLEAR CLEAR   Specific Gravity, Urine 1.010 1.005 - 1.030   pH 6.0 5.0 - 8.0   Glucose, UA NEGATIVE NEGATIVE mg/dL   Hgb urine dipstick NEGATIVE NEGATIVE   Bilirubin Urine NEGATIVE NEGATIVE   Ketones, ur NEGATIVE NEGATIVE mg/dL   Protein, ur NEGATIVE NEGATIVE mg/dL   Urobilinogen, UA 0.2 0.0 - 1.0 mg/dL   Nitrite NEGATIVE NEGATIVE  Leukocytes, UA NEGATIVE NEGATIVE    Comment: MICROSCOPIC NOT DONE ON URINES WITH NEGATIVE PROTEIN, BLOOD, LEUKOCYTES, NITRITE, OR GLUCOSE <1000 mg/dL.  Urine rapid drug screen (hosp performed)     Status: Abnormal   Collection Time: 02/21/15  9:27 PM  Result Value Ref Range   Opiates NONE DETECTED NONE DETECTED   Cocaine NONE DETECTED NONE DETECTED   Benzodiazepines POSITIVE (A) NONE DETECTED   Amphetamines NONE DETECTED NONE DETECTED   Tetrahydrocannabinol NONE DETECTED NONE DETECTED    Barbiturates NONE DETECTED NONE DETECTED    Comment:        DRUG SCREEN FOR MEDICAL PURPOSES ONLY.  IF CONFIRMATION IS NEEDED FOR ANY PURPOSE, NOTIFY LAB WITHIN 5 DAYS.        LOWEST DETECTABLE LIMITS FOR URINE DRUG SCREEN Drug Class       Cutoff (ng/mL) Amphetamine      1000 Barbiturate      200 Benzodiazepine   621 Tricyclics       308 Opiates          300 Cocaine          300 THC              50   CBC     Status: Abnormal   Collection Time: 02/22/15  5:23 AM  Result Value Ref Range   WBC 5.3 4.0 - 10.5 K/uL   RBC 3.59 (L) 3.87 - 5.11 MIL/uL   Hemoglobin 11.2 (L) 12.0 - 15.0 g/dL   HCT 33.1 (L) 36.0 - 46.0 %   MCV 92.2 78.0 - 100.0 fL   MCH 31.2 26.0 - 34.0 pg   MCHC 33.8 30.0 - 36.0 g/dL   RDW 12.2 11.5 - 15.5 %   Platelets 220 150 - 400 K/uL  Comprehensive metabolic panel     Status: Abnormal   Collection Time: 02/22/15  5:23 AM  Result Value Ref Range   Sodium 138 135 - 145 mmol/L   Potassium 3.8 3.5 - 5.1 mmol/L   Chloride 110 101 - 111 mmol/L   CO2 24 22 - 32 mmol/L   Glucose, Bld 92 65 - 99 mg/dL   BUN 11 6 - 20 mg/dL   Creatinine, Ser 0.64 0.44 - 1.00 mg/dL   Calcium 8.4 (L) 8.9 - 10.3 mg/dL   Total Protein 6.0 (L) 6.5 - 8.1 g/dL   Albumin 3.4 (L) 3.5 - 5.0 g/dL   AST 27 15 - 41 U/L   ALT 21 14 - 54 U/L   Alkaline Phosphatase 47 38 - 126 U/L   Total Bilirubin 1.0 0.3 - 1.2 mg/dL   GFR calc non Af Amer >60 >60 mL/min   GFR calc Af Amer >60 >60 mL/min    Comment: (NOTE) The eGFR has been calculated using the CKD EPI equation. This calculation has not been validated in all clinical situations. eGFR's persistently <60 mL/min signify possible Chronic Kidney Disease.    Anion gap 4 (L) 5 - 15  Magnesium     Status: None   Collection Time: 02/22/15  5:23 AM  Result Value Ref Range   Magnesium 1.8 1.7 - 2.4 mg/dL  Glucose, capillary     Status: None   Collection Time: 02/22/15  9:00 AM  Result Value Ref Range   Glucose-Capillary 66 65 - 99 mg/dL    Comment 1 Notify RN    Comment 2 Document in Chart   Glucose, capillary     Status: Abnormal   Collection Time:  02/22/15  9:08 AM  Result Value Ref Range   Glucose-Capillary 160 (H) 65 - 99 mg/dL   Comment 1 Notify RN    Comment 2 Document in Chart   Urinalysis, Routine w reflex microscopic (not at [facility])     Status: Abnormal   Collection Time: 02/22/15  9:33 AM  Result Value Ref Range   Color, Urine YELLOW YELLOW   APPearance CLEAR CLEAR   Specific Gravity, Urine 1.010 1.005 - 1.030   pH 6.0 5.0 - 8.0   Glucose, UA 500 (A) NEGATIVE mg/dL   Hgb urine dipstick NEGATIVE NEGATIVE   Bilirubin Urine NEGATIVE NEGATIVE   Ketones, ur NEGATIVE NEGATIVE mg/dL   Protein, ur NEGATIVE NEGATIVE mg/dL   Urobilinogen, UA 0.2 0.0 - 1.0 mg/dL   Nitrite NEGATIVE NEGATIVE   Leukocytes, UA NEGATIVE NEGATIVE    Comment: MICROSCOPIC NOT DONE ON URINES WITH NEGATIVE PROTEIN, BLOOD, LEUKOCYTES, NITRITE, OR GLUCOSE <1000 mg/dL.  MRSA PCR Screening     Status: Abnormal   Collection Time: 02/22/15  9:34 AM  Result Value Ref Range   MRSA by PCR INVALID RESULTS, SPECIMEN SENT FOR CULTURE (A) NEGATIVE    Comment: Results Called to: HILTON L. AT 1245P ON [id number] BY THOMPSON S.        The GeneXpert MRSA Assay (FDA approved for NASAL specimens only), is one component of a comprehensive MRSA colonization surveillance program. It is not intended to diagnose MRSA infection nor to guide or monitor treatment for MRSA infections.   Draw ABG 1 hour after initiation of ventilator     Status: Abnormal   Collection Time: 02/22/15 10:15 AM  Result Value Ref Range   FIO2 40.00 %   Delivery systems VENTILATOR    Mode PRESSURE REGULATED VOLUME CONTROL    VT 450 mL   Rate 14 resp/min   Peep/cpap 5.0 cm H20   pH, Arterial 7.455 (H) 7.350 - 7.450   pCO2 arterial 29.7 (L) 35.0 - 45.0 mmHg   pO2, Arterial 181 (H) 80.0 - 100.0 mmHg   Bicarbonate 20.6 20.0 - 24.0 mEq/L   TCO2 18.3 0 - 100 mmol/L   Acid-base deficit  2.7 (H) 0.0 - 2.0 mmol/L   O2 Saturation 99.3 %   Patient temperature 37.0    Collection site RIGHT RADIAL    Drawn by [id number]    Sample type ARTERIAL    Allens test (pass/fail) PASS PASS  Glucose, capillary     Status: Abnormal   Collection Time: 02/22/15 11:21 AM  Result Value Ref Range   Glucose-Capillary 116 (H) 65 - 99 mg/dL   Comment 1 Notify RN    Comment 2 Document in Chart   Glucose, capillary     Status: None   Collection Time: 02/22/15  1:03 PM  Result Value Ref Range   Glucose-Capillary 80 65 - 99 mg/dL   Comment 1 Notify RN    Comment 2 Document in Chart   Glucose, capillary     Status: None   Collection Time: 02/22/15  2:49 PM  Result Value Ref Range   Glucose-Capillary 67 65 - 99 mg/dL  Glucose, capillary     Status: Abnormal   Collection Time: 02/22/15  4:59 PM  Result Value Ref Range   Glucose-Capillary 165 (H) 65 - 99 mg/dL   Comment 1 Document in Chart    Comment 2 Repeat Test   Glucose, capillary     Status: Abnormal   Collection Time: 02/22/15  8:52 PM  Result  Value Ref Range   Glucose-Capillary 159 (H) 65 - 99 mg/dL  Glucose, capillary     Status: Abnormal   Collection Time: 02/23/15 12:03 AM  Result Value Ref Range   Glucose-Capillary 155 (H) 65 - 99 mg/dL  Blood gas, arterial     Status: Abnormal   Collection Time: 02/23/15  4:00 AM  Result Value Ref Range   FIO2 0.40 %   Delivery systems VENTILATOR    Mode PRESSURE REGULATED VOLUME CONTROL    VT 450 mL   Rate 14 resp/min   Peep/cpap 5.0 cm H20   pH, Arterial 7.435 7.350 - 7.450   pCO2 arterial 30.8 (L) 35.0 - 45.0 mmHg   pO2, Arterial 193 (H) 80.0 - 100.0 mmHg   Bicarbonate 20.3 20.0 - 24.0 mEq/L   TCO2 18.5 0 - 100 mmol/L   Acid-base deficit 3.2 (H) 0.0 - 2.0 mmol/L   O2 Saturation 99.0 %   Patient temperature 37.0    Collection site LEFT RADIAL    Drawn by [id number]    Sample type ARTERIAL DRAW    Allens test (pass/fail) PASS PASS  Glucose, capillary     Status: Abnormal   Collection  Time: 02/23/15  4:30 AM  Result Value Ref Range   Glucose-Capillary 171 (H) 65 - 99 mg/dL  Glucose, capillary     Status: Abnormal   Collection Time: 02/23/15  7:41 AM  Result Value Ref Range   Glucose-Capillary 175 (H) 65 - 99 mg/dL   Comment 1 Notify RN    Comment 2 Document in Chart   Basic metabolic panel     Status: Abnormal   Collection Time: 02/23/15  8:41 AM  Result Value Ref Range   Sodium 131 (L) 135 - 145 mmol/L    Comment: DELTA CHECK NOTED   Potassium 3.3 (L) 3.5 - 5.1 mmol/L   Chloride 100 (L) 101 - 111 mmol/L   CO2 24 22 - 32 mmol/L   Glucose, Bld 218 (H) 65 - 99 mg/dL   BUN 5 (L) 6 - 20 mg/dL   Creatinine, Ser 0.65 0.44 - 1.00 mg/dL   Calcium 8.2 (L) 8.9 - 10.3 mg/dL   GFR calc non Af Amer >60 >60 mL/min   GFR calc Af Amer >60 >60 mL/min    Comment: (NOTE) The eGFR has been calculated using the CKD EPI equation. This calculation has not been validated in all clinical situations. eGFR's persistently <60 mL/min signify possible Chronic Kidney Disease.    Anion gap 7 5 - 15  Magnesium     Status: Abnormal   Collection Time: 02/23/15  8:41 AM  Result Value Ref Range   Magnesium 1.5 (L) 1.7 - 2.4 mg/dL  CBC     Status: Abnormal   Collection Time: 02/23/15  8:41 AM  Result Value Ref Range   WBC 14.1 (H) 4.0 - 10.5 K/uL   RBC 3.60 (L) 3.87 - 5.11 MIL/uL   Hemoglobin 11.2 (L) 12.0 - 15.0 g/dL   HCT 32.8 (L) 36.0 - 46.0 %   MCV 91.1 78.0 - 100.0 fL   MCH 31.1 26.0 - 34.0 pg   MCHC 34.1 30.0 - 36.0 g/dL   RDW 12.1 11.5 - 15.5 %   Platelets 218 150 - 400 K/uL  Glucose, capillary     Status: Abnormal   Collection Time: 02/23/15 11:24 AM  Result Value Ref Range   Glucose-Capillary 163 (H) 65 - 99 mg/dL   Comment 1 Notify RN  Comment 2 Document in Chart   Glucose, capillary     Status: Abnormal   Collection Time: 02/23/15  4:40 PM  Result Value Ref Range   Glucose-Capillary 115 (H) 65 - 99 mg/dL   Comment 1 Notify RN    Comment 2 Document in Chart     Glucose, capillary     Status: Abnormal   Collection Time: 02/23/15  8:12 PM  Result Value Ref Range   Glucose-Capillary 141 (H) 65 - 99 mg/dL    Lipid Panel No results for input(s): CHOL, TRIG, HDL, CHOLHDL, VLDL, LDLCALC in the last 72 hours.  Studies/Results:   Medications:  Scheduled Meds: . antiseptic oral rinse  7 mL Mouth Rinse QID  . chlorhexidine  15 mL Mouth Rinse BID  . levETIRAcetam  1,000 mg Intravenous Q12H  . pantoprazole (PROTONIX) IV  40 mg Intravenous Daily   Continuous Infusions:  PRN Meds:.[DISCONTINUED] ondansetron **OR** ondansetron (ZOFRAN) IV     LOS: 2 days    A. Merlene Harvey, M.D.  Diplomate, Tax adviser of Psychiatry and Neurology ( Neurology).
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
## 2015-02-23 NOTE — Progress Notes (Signed)
Subjective: She is doing better. She has not had any more seizures through the night. She remains intubated. When her sedation is reduced she is awake and she is slowly coming out of her sedation this morning.  Objective: Vital signs in last 24 hours: Temp:  [96.8 F (36 C)-98.5 F (36.9 C)] 98.2 F (36.8 C) (07/12 0812) Pulse Rate:  [57-122] 85 (07/12 0800) Resp:  [12-22] 15 (07/12 0800) BP: (75-135)/(58-85) 133/71 mmHg (07/12 0800) SpO2:  [97 %-100 %] 100 % (07/12 0800) FiO2 (%):  [40 %] 40 % (07/12 0814) Weight:  [77 kg (169 lb 12.1 oz)] 77 kg (169 lb 12.1 oz) (07/12 0500) Weight change: 3.7 kg (8 lb 2.5 oz) Last BM Date:  (UTA)  Intake/Output from previous day: 07/11 0701 - 07/12 0700 In: 3648.5 [I.V.:2948.5; IV Piggyback:100] Out: 1400 [Urine:1400]  PHYSICAL EXAM General appearance: Sedated and intubated on mechanical ventilation Resp: clear to auscultation bilaterally Cardio: regular rate and rhythm, S1, S2 normal, no murmur, click, rub or gallop GI: soft, non-tender; bowel sounds normal; no masses,  no organomegaly Extremities: extremities normal, atraumatic, no cyanosis or edema  Lab Results:  Results for orders placed or performed during the hospital encounter of 02/21/15 (from the past 48 hour(s))  POC CBG, ED     Status: None   Collection Time: 02/21/15  1:47 PM  Result Value Ref Range   Glucose-Capillary 79 65 - 99 mg/dL  CBC with Differential     Status: Abnormal   Collection Time: 02/21/15  2:08 PM  Result Value Ref Range   WBC 6.6 4.0 - 10.5 K/uL   RBC 4.15 3.87 - 5.11 MIL/uL   Hemoglobin 12.7 12.0 - 15.0 g/dL   HCT 37.7 36.0 - 46.0 %   MCV 90.8 78.0 - 100.0 fL   MCH 30.6 26.0 - 34.0 pg   MCHC 33.7 30.0 - 36.0 g/dL   RDW 12.2 11.5 - 15.5 %   Platelets 250 150 - 400 K/uL   Neutrophils Relative % 61 43 - 77 %   Neutro Abs 4.1 1.7 - 7.7 K/uL   Lymphocytes Relative 20 12 - 46 %   Lymphs Abs 1.3 0.7 - 4.0 K/uL   Monocytes Relative 8 3 - 12 %   Monocytes  Absolute 0.5 0.1 - 1.0 K/uL   Eosinophils Relative 11 (H) 0 - 5 %   Eosinophils Absolute 0.7 0.0 - 0.7 K/uL   Basophils Relative 0 0 - 1 %   Basophils Absolute 0.0 0.0 - 0.1 K/uL  Comprehensive metabolic panel     Status: None   Collection Time: 02/21/15  2:08 PM  Result Value Ref Range   Sodium 140 135 - 145 mmol/L   Potassium 3.6 3.5 - 5.1 mmol/L   Chloride 110 101 - 111 mmol/L   CO2 24 22 - 32 mmol/L   Glucose, Bld 90 65 - 99 mg/dL   BUN 14 6 - 20 mg/dL   Creatinine, Ser 0.81 0.44 - 1.00 mg/dL   Calcium 9.4 8.9 - 10.3 mg/dL   Total Protein 7.3 6.5 - 8.1 g/dL   Albumin 4.3 3.5 - 5.0 g/dL   AST 35 15 - 41 U/L   ALT 27 14 - 54 U/L   Alkaline Phosphatase 53 38 - 126 U/L   Total Bilirubin 0.7 0.3 - 1.2 mg/dL   GFR calc non Af Amer >60 >60 mL/min   GFR calc Af Amer >60 >60 mL/min    Comment: (NOTE) The eGFR has  been calculated using the CKD EPI equation. This calculation has not been validated in all clinical situations. eGFR's persistently <60 mL/min signify possible Chronic Kidney Disease.    Anion gap 6 5 - 15  Ethanol     Status: None   Collection Time: 02/21/15  2:08 PM  Result Value Ref Range   Alcohol, Ethyl (B) <5 <5 mg/dL    Comment:        LOWEST DETECTABLE LIMIT FOR SERUM ALCOHOL IS 5 mg/dL FOR MEDICAL PURPOSES ONLY   Pregnancy, urine     Status: None   Collection Time: 02/21/15  9:27 PM  Result Value Ref Range   Preg Test, Ur NEGATIVE NEGATIVE    Comment:        THE SENSITIVITY OF THIS METHODOLOGY IS >20 mIU/mL.   Urinalysis, Routine w reflex microscopic (not at [facility])     Status: None   Collection Time: 02/21/15  9:27 PM  Result Value Ref Range   Color, Urine YELLOW YELLOW   APPearance CLEAR CLEAR   Specific Gravity, Urine 1.010 1.005 - 1.030   pH 6.0 5.0 - 8.0   Glucose, UA NEGATIVE NEGATIVE mg/dL   Hgb urine dipstick NEGATIVE NEGATIVE   Bilirubin Urine NEGATIVE NEGATIVE   Ketones, ur NEGATIVE NEGATIVE mg/dL   Protein, ur NEGATIVE NEGATIVE mg/dL    Urobilinogen, UA 0.2 0.0 - 1.0 mg/dL   Nitrite NEGATIVE NEGATIVE   Leukocytes, UA NEGATIVE NEGATIVE    Comment: MICROSCOPIC NOT DONE ON URINES WITH NEGATIVE PROTEIN, BLOOD, LEUKOCYTES, NITRITE, OR GLUCOSE <1000 mg/dL.  Urine rapid drug screen (hosp performed)     Status: Abnormal   Collection Time: 02/21/15  9:27 PM  Result Value Ref Range   Opiates NONE DETECTED NONE DETECTED   Cocaine NONE DETECTED NONE DETECTED   Benzodiazepines POSITIVE (A) NONE DETECTED   Amphetamines NONE DETECTED NONE DETECTED   Tetrahydrocannabinol NONE DETECTED NONE DETECTED   Barbiturates NONE DETECTED NONE DETECTED    Comment:        DRUG SCREEN FOR MEDICAL PURPOSES ONLY.  IF CONFIRMATION IS NEEDED FOR ANY PURPOSE, NOTIFY LAB WITHIN 5 DAYS.        LOWEST DETECTABLE LIMITS FOR URINE DRUG SCREEN Drug Class       Cutoff (ng/mL) Amphetamine      1000 Barbiturate      200 Benzodiazepine   016 Tricyclics       010 Opiates          300 Cocaine          300 THC              50   CBC     Status: Abnormal   Collection Time: 02/22/15  5:23 AM  Result Value Ref Range   WBC 5.3 4.0 - 10.5 K/uL   RBC 3.59 (L) 3.87 - 5.11 MIL/uL   Hemoglobin 11.2 (L) 12.0 - 15.0 g/dL   HCT 33.1 (L) 36.0 - 46.0 %   MCV 92.2 78.0 - 100.0 fL   MCH 31.2 26.0 - 34.0 pg   MCHC 33.8 30.0 - 36.0 g/dL   RDW 12.2 11.5 - 15.5 %   Platelets 220 150 - 400 K/uL  Comprehensive metabolic panel     Status: Abnormal   Collection Time: 02/22/15  5:23 AM  Result Value Ref Range   Sodium 138 135 - 145 mmol/L   Potassium 3.8 3.5 - 5.1 mmol/L   Chloride 110 101 - 111 mmol/L   CO2 24  22 - 32 mmol/L   Glucose, Bld 92 65 - 99 mg/dL   BUN 11 6 - 20 mg/dL   Creatinine, Ser 0.64 0.44 - 1.00 mg/dL   Calcium 8.4 (L) 8.9 - 10.3 mg/dL   Total Protein 6.0 (L) 6.5 - 8.1 g/dL   Albumin 3.4 (L) 3.5 - 5.0 g/dL   AST 27 15 - 41 U/L   ALT 21 14 - 54 U/L   Alkaline Phosphatase 47 38 - 126 U/L   Total Bilirubin 1.0 0.3 - 1.2 mg/dL   GFR calc non Af Amer  >60 >60 mL/min   GFR calc Af Amer >60 >60 mL/min    Comment: (NOTE) The eGFR has been calculated using the CKD EPI equation. This calculation has not been validated in all clinical situations. eGFR's persistently <60 mL/min signify possible Chronic Kidney Disease.    Anion gap 4 (L) 5 - 15  Magnesium     Status: None   Collection Time: 02/22/15  5:23 AM  Result Value Ref Range   Magnesium 1.8 1.7 - 2.4 mg/dL  Glucose, capillary     Status: None   Collection Time: 02/22/15  9:00 AM  Result Value Ref Range   Glucose-Capillary 66 65 - 99 mg/dL   Comment 1 Notify RN    Comment 2 Document in Chart   Glucose, capillary     Status: Abnormal   Collection Time: 02/22/15  9:08 AM  Result Value Ref Range   Glucose-Capillary 160 (H) 65 - 99 mg/dL   Comment 1 Notify RN    Comment 2 Document in Chart   Urinalysis, Routine w reflex microscopic (not at [facility])     Status: Abnormal   Collection Time: 02/22/15  9:33 AM  Result Value Ref Range   Color, Urine YELLOW YELLOW   APPearance CLEAR CLEAR   Specific Gravity, Urine 1.010 1.005 - 1.030   pH 6.0 5.0 - 8.0   Glucose, UA 500 (A) NEGATIVE mg/dL   Hgb urine dipstick NEGATIVE NEGATIVE   Bilirubin Urine NEGATIVE NEGATIVE   Ketones, ur NEGATIVE NEGATIVE mg/dL   Protein, ur NEGATIVE NEGATIVE mg/dL   Urobilinogen, UA 0.2 0.0 - 1.0 mg/dL   Nitrite NEGATIVE NEGATIVE   Leukocytes, UA NEGATIVE NEGATIVE    Comment: MICROSCOPIC NOT DONE ON URINES WITH NEGATIVE PROTEIN, BLOOD, LEUKOCYTES, NITRITE, OR GLUCOSE <1000 mg/dL.  MRSA PCR Screening     Status: Abnormal   Collection Time: 02/22/15  9:34 AM  Result Value Ref Range   MRSA by PCR INVALID RESULTS, SPECIMEN SENT FOR CULTURE (A) NEGATIVE    Comment: Results Called to: HILTON L. AT 1245P ON [id number] BY THOMPSON S.        The GeneXpert MRSA Assay (FDA approved for NASAL specimens only), is one component of a comprehensive MRSA colonization surveillance program. It is not intended to diagnose  MRSA infection nor to guide or monitor treatment for MRSA infections.   Draw ABG 1 hour after initiation of ventilator     Status: Abnormal   Collection Time: 02/22/15 10:15 AM  Result Value Ref Range   FIO2 40.00 %   Delivery systems VENTILATOR    Mode PRESSURE REGULATED VOLUME CONTROL    VT 450 mL   Rate 14 resp/min   Peep/cpap 5.0 cm H20   pH, Arterial 7.455 (H) 7.350 - 7.450   pCO2 arterial 29.7 (L) 35.0 - 45.0 mmHg   pO2, Arterial 181 (H) 80.0 - 100.0 mmHg   Bicarbonate 20.6 20.0 -  24.0 mEq/L   TCO2 18.3 0 - 100 mmol/L   Acid-base deficit 2.7 (H) 0.0 - 2.0 mmol/L   O2 Saturation 99.3 %   Patient temperature 37.0    Collection site RIGHT RADIAL    Drawn by [id number]    Sample type ARTERIAL    Allens test (pass/fail) PASS PASS  Glucose, capillary     Status: Abnormal   Collection Time: 02/22/15 11:21 AM  Result Value Ref Range   Glucose-Capillary 116 (H) 65 - 99 mg/dL   Comment 1 Notify RN    Comment 2 Document in Chart   Glucose, capillary     Status: None   Collection Time: 02/22/15  1:03 PM  Result Value Ref Range   Glucose-Capillary 80 65 - 99 mg/dL   Comment 1 Notify RN    Comment 2 Document in Chart   Glucose, capillary     Status: None   Collection Time: 02/22/15  2:49 PM  Result Value Ref Range   Glucose-Capillary 67 65 - 99 mg/dL  Glucose, capillary     Status: Abnormal   Collection Time: 02/22/15  4:59 PM  Result Value Ref Range   Glucose-Capillary 165 (H) 65 - 99 mg/dL   Comment 1 Document in Chart    Comment 2 Repeat Test   Glucose, capillary     Status: Abnormal   Collection Time: 02/22/15  8:52 PM  Result Value Ref Range   Glucose-Capillary 159 (H) 65 - 99 mg/dL  Glucose, capillary     Status: Abnormal   Collection Time: 02/23/15 12:03 AM  Result Value Ref Range   Glucose-Capillary 155 (H) 65 - 99 mg/dL  Blood gas, arterial     Status: Abnormal   Collection Time: 02/23/15  4:00 AM  Result Value Ref Range   FIO2 0.40 %   Delivery systems  VENTILATOR    Mode PRESSURE REGULATED VOLUME CONTROL    VT 450 mL   Rate 14 resp/min   Peep/cpap 5.0 cm H20   pH, Arterial 7.435 7.350 - 7.450   pCO2 arterial 30.8 (L) 35.0 - 45.0 mmHg   pO2, Arterial 193 (H) 80.0 - 100.0 mmHg   Bicarbonate 20.3 20.0 - 24.0 mEq/L   TCO2 18.5 0 - 100 mmol/L   Acid-base deficit 3.2 (H) 0.0 - 2.0 mmol/L   O2 Saturation 99.0 %   Patient temperature 37.0    Collection site LEFT RADIAL    Drawn by [id number]    Sample type ARTERIAL DRAW    Allens test (pass/fail) PASS PASS  Glucose, capillary     Status: Abnormal   Collection Time: 02/23/15  4:30 AM  Result Value Ref Range   Glucose-Capillary 171 (H) 65 - 99 mg/dL  Glucose, capillary     Status: Abnormal   Collection Time: 02/23/15  7:41 AM  Result Value Ref Range   Glucose-Capillary 175 (H) 65 - 99 mg/dL   Comment 1 Notify RN    Comment 2 Document in Chart     ABGS  Recent Labs  02/23/15 0400  PHART 7.435  PO2ART 193*  TCO2 18.5  HCO3 20.3   CULTURES Recent Results (from the past 240 hour(s))  MRSA PCR Screening     Status: Abnormal   Collection Time: 02/22/15  9:34 AM  Result Value Ref Range Status   MRSA by PCR INVALID RESULTS, SPECIMEN SENT FOR CULTURE (A) NEGATIVE Final    Comment: Results Called to: HILTON L. AT 1245P ON [id number] BY THOMPSON S.  The GeneXpert MRSA Assay (FDA approved for NASAL specimens only), is one component of a comprehensive MRSA colonization surveillance program. It is not intended to diagnose MRSA infection nor to guide or monitor treatment for MRSA infections.    Studies/Results: Ct Head Wo Contrast  02/21/2015   CLINICAL DATA:  Seizure.  EXAM: CT HEAD WITHOUT CONTRAST  TECHNIQUE: Contiguous axial images were obtained from the base of the skull through the vertex without intravenous contrast.  COMPARISON:  None.  FINDINGS: No evidence of intracranial hemorrhage, brain edema, or other signs of acute infarction. No evidence of intracranial mass lesion  or mass effect.  No abnormal extraaxial fluid collections identified. Ventricles are normal in size. No skull abnormality identified.  IMPRESSION: Negative noncontrast head CT.   Electronically Signed   By: Earle Gell M.D.   On: 02/21/2015 15:23   Dg Chest Port 1 View  02/23/2015   CLINICAL DATA:  Hypoxia  EXAM: PORTABLE CHEST - 1 VIEW  COMPARISON:  February 22, 2015  FINDINGS: Endotracheal tube tip is 3.2 cm above the carina. No pneumothorax. The lungs are clear. The heart size and pulmonary vascularity are normal. No adenopathy. No bone lesions.  IMPRESSION: Endotracheal tube as described without pneumothorax. No edema or consolidation.   Electronically Signed   By: Lowella Grip III M.D.   On: 02/23/2015 07:57   Dg Chest Port 1 View  02/22/2015   CLINICAL DATA:  Endotracheal tube placement.  Seizure.  EXAM: PORTABLE CHEST - 1 VIEW  COMPARISON:  None.  FINDINGS: Endotracheal tube is roughly 2 cm above the carina. Lungs are clear. Negative for a pneumothorax. Heart and mediastinum are within normal limits. No acute bone abnormality.  IMPRESSION: Endotracheal tube is appropriately positioned. No focal lung disease.   Electronically Signed   By: Markus Daft M.D.   On: 02/22/2015 09:46    Medications:  Prior to Admission:  No prescriptions prior to admission   Scheduled: . antiseptic oral rinse  7 mL Mouth Rinse QID  . chlorhexidine  15 mL Mouth Rinse BID  . fentaNYL (SUBLIMAZE) injection  50 mcg Intravenous Once  . levETIRAcetam  1,000 mg Intravenous Q12H  . pantoprazole (PROTONIX) IV  40 mg Intravenous Daily   Continuous: . dextrose 5 % and 0.45% NaCl 125 mL/hr at 02/23/15 0800  . fentaNYL infusion INTRAVENOUS Stopped (02/23/15 0800)   ZYS:[id number], fentaNYL (SUBLIMAZE) injection, midazolam, [DISCONTINUED] ondansetron **OR** ondansetron (ZOFRAN) IV  Assesment: She has seizures probably status epilepticus. She required intubation for airway protection. She has not had any more seizures  overnight. She does not have any known lung disease. Once she is awake I anticipate that we should be able to extubate her Active Problems:   Seizure    Plan: Extubation once she is awake    LOS: 2 days   , L 02/23/2015, 8:22 AM

## 2015-02-24 ENCOUNTER — Inpatient Hospital Stay (HOSPITAL_COMMUNITY): Payer: PRIVATE HEALTH INSURANCE

## 2015-02-24 DIAGNOSIS — E871 Hypo-osmolality and hyponatremia: Secondary | ICD-10-CM

## 2015-02-24 DIAGNOSIS — G40901 Epilepsy, unspecified, not intractable, with status epilepticus: Secondary | ICD-10-CM | POA: Diagnosis present

## 2015-02-24 DIAGNOSIS — G40301 Generalized idiopathic epilepsy and epileptic syndromes, not intractable, with status epilepticus: Secondary | ICD-10-CM

## 2015-02-24 DIAGNOSIS — J9601 Acute respiratory failure with hypoxia: Secondary | ICD-10-CM

## 2015-02-24 DIAGNOSIS — R569 Unspecified convulsions: Secondary | ICD-10-CM

## 2015-02-24 DIAGNOSIS — G9340 Encephalopathy, unspecified: Secondary | ICD-10-CM

## 2015-02-24 LAB — GLUCOSE, CAPILLARY
Glucose-Capillary: 105 mg/dL — ABNORMAL HIGH (ref 65–99)
Glucose-Capillary: 83 mg/dL (ref 65–99)
Glucose-Capillary: 96 mg/dL (ref 65–99)

## 2015-02-24 LAB — BASIC METABOLIC PANEL
ANION GAP: 5 (ref 5–15)
BUN: 5 mg/dL — AB (ref 6–20)
CALCIUM: 8.1 mg/dL — AB (ref 8.9–10.3)
CO2: 24 mmol/L (ref 22–32)
Chloride: 112 mmol/L — ABNORMAL HIGH (ref 101–111)
Creatinine, Ser: 0.75 mg/dL (ref 0.44–1.00)
GFR calc Af Amer: >60 mL/min (ref >60)
GFR calc non Af Amer: >60 mL/min (ref >60)
GLUCOSE: 104 mg/dL — AB (ref 65–99)
POTASSIUM: 3.5 mmol/L (ref 3.5–5.1)
Sodium: 141 mmol/L (ref 135–145)

## 2015-02-24 LAB — CBC
HCT: 31 % — ABNORMAL LOW (ref 36.0–46.0)
HEMOGLOBIN: 10.5 g/dL — AB (ref 12.0–15.0)
MCH: 31.2 pg (ref 26.0–34.0)
MCHC: 33.9 g/dL (ref 30.0–36.0)
MCV: 92 fL (ref 78.0–100.0)
Platelets: 194 10*3/uL (ref 150–400)
RBC: 3.37 MIL/uL — ABNORMAL LOW (ref 3.87–5.11)
RDW: 12.3 % (ref 11.5–15.5)
WBC: 9.5 10*3/uL (ref 4.0–10.5)

## 2015-02-24 LAB — MAGNESIUM: Magnesium: 2 mg/dL (ref 1.7–2.4)

## 2015-02-24 LAB — MRSA CULTURE

## 2015-02-24 MED ORDER — ACETAMINOPHEN 325 MG PO TABS
650.0000 mg | ORAL_TABLET | Freq: Four times a day (QID) | ORAL | Status: DC | PRN
  Administered 2015-02-24: 650 mg via ORAL
  Filled 2015-02-24: qty 2

## 2015-02-24 MED ORDER — ALBUTEROL SULFATE (2.5 MG/3ML) 0.083% IN NEBU
2.5000 mg | INHALATION_SOLUTION | RESPIRATORY_TRACT | Status: DC
  Administered 2015-02-24: 2.5 mg via RESPIRATORY_TRACT
  Filled 2015-02-24 (×2): qty 3

## 2015-02-24 MED ORDER — GUAIFENESIN ER 600 MG PO TB12
1200.0000 mg | ORAL_TABLET | Freq: Two times a day (BID) | ORAL | Status: DC
Start: 2015-02-24 — End: 2015-02-25
  Administered 2015-02-24 – 2015-02-25 (×3): 1200 mg via ORAL
  Filled 2015-02-24 (×5): qty 2

## 2015-02-24 NOTE — Progress Notes (Signed)
Pt is clear in her throat and lungs. Pt needs to try to do for herself. The family is doing everything for her and she isnt having to move. I explained to the Pt that she needed to move around more.RT has [place] her [place]

## 2015-02-24 NOTE — Care Management Note (Signed)
Case Management Note  Patient Details  Name: Megan Harvey MRN: [id number] Date of Birth: 12/21/1975   Expected Discharge Date:     02/25/2015             Expected Discharge Plan:  Home/Self Care  In-House Referral:  NA  Discharge planning Services  CM Consult  Post Acute Care Choice:    Choice offered to:  NA  DME Arranged:    DME Agency:     HH Arranged:    [place]:     Status of Service:  Completed, signed off  Medicare Important Message Given:    Date Medicare IM Given:    Medicare IM give by:    Date Additional Medicare IM Given:    Additional Medicare Important Message give by:     If discussed at [place] of Stay Meetings, dates discussed:    Additional Comments: Pt is from home, lives with husband and is independent with ADL's. Pt has no DME needs or HH services prior to admission. Pt plans to return home at DC with self care. No CM needs.  Sherald Barge, RN 02/24/2015, 8:29 AM

## 2015-02-24 NOTE — Progress Notes (Signed)
Patient ID: Megan Harvey, female   DOB: 1976-06-28, 39 y.o.   MRN: [id number]  Hendron A. Merlene Laughter, MD     [email or web address]          Megan Harvey is an 39 y.o. female.   Assessment/Plan:  1. This is seems to be status epilepticus which is resolving. Etiology unclear.  2. Encephalopathy due to the above and also due to medications.  RECOMMENDATION:  Continue with Keppra thousand milligrams twice a day. We can likely switch this to oral. Seizure precautions including no driving.  She is a good day today. She has been up and eating and walking. Her throat feels a lot better. No seizures or other events are reported.   GENERAL: Extubated  HEENT: Supple. Atraumatic normocephalic.   ABDOMEN: soft  EXTREMITIES: No edema   BACK: Normal.  SKIN: Normal by inspection.   MENTAL STATUS: She is awake and alert. She follows commands and speaking simple sentences. She appears mildly dyspneic.  CRANIAL NERVES: Pupils are equal, round and reactive to light; extra ocular movements are full, there is no significant nystagmus; visual fields - Limited but seemed to respond to direct strength bilaterally; upper and lower facial muscles are normal in strength and symmetric, there is no flattening of the nasolabial folds. Gag reflexes and corneal reflexes are present.  MOTOR: Bulk and tone are normal throughout. She has antigravity strength in the upper extremities and legs.  COORDINATION: No evidence of tremors, parkinsonism or dysmetria.   The brain MRI scan is reviewed in person. There is low setting cerebellar tonsils without meeting criteria for Arnold-Chiari malformation. No sclerosis is seen of the mesial temporal lobe. Nothing acute is seen. No infarcts or other lesions are observed.   Objective: Vital signs in last 24 hours: Temp:  [97.8 F (36.6 C)-99.3 F (37.4 C)] 97.8 F (36.6 C) (07/13 1300) Pulse Rate:  [62-109] 91 (07/13 1300) Resp:  [12-29] 17 (07/13  1000) BP: (100-129)/(58-79) 127/68 mmHg (07/13 1300) SpO2:  [95 %-100 %] 100 % (07/13 1300) FiO2 (%):  [28 %] 28 % (07/13 0800) Weight:  [80.9 kg (178 lb 5.6 oz)] 80.9 kg (178 lb 5.6 oz) (07/13 0500)  Intake/Output from previous day: 07/12 0701 - 07/13 0700 In: 660 [P.O.:660] Out: 5825 [Urine:5825] Intake/Output this shift:   Nutritional status: Diet regular Room service appropriate?: Yes; Fluid consistency:: Thin   Lab Results: Results for orders placed or performed during the hospital encounter of 02/21/15 (from the past 48 hour(s))  Glucose, capillary     Status: Abnormal   Collection Time: 02/22/15  8:52 PM  Result Value Ref Range   Glucose-Capillary 159 (H) 65 - 99 mg/dL  Glucose, capillary     Status: Abnormal   Collection Time: 02/23/15 12:03 AM  Result Value Ref Range   Glucose-Capillary 155 (H) 65 - 99 mg/dL  Blood gas, arterial     Status: Abnormal   Collection Time: 02/23/15  4:00 AM  Result Value Ref Range   FIO2 0.40 %   Delivery systems VENTILATOR    Mode PRESSURE REGULATED VOLUME CONTROL    VT 450 mL   Rate 14 resp/min   Peep/cpap 5.0 cm H20   pH, Arterial 7.435 7.350 - 7.450   pCO2 arterial 30.8 (L) 35.0 - 45.0 mmHg   pO2, Arterial 193 (H) 80.0 - 100.0 mmHg   Bicarbonate 20.3 20.0 - 24.0 mEq/L   TCO2 18.5 0 - 100 mmol/L   Acid-base deficit 3.2 (H) 0.0 - 2.0  mmol/L   O2 Saturation 99.0 %   Patient temperature 37.0    Collection site LEFT RADIAL    Drawn by [phone]    Sample type ARTERIAL DRAW    Allens test (pass/fail) PASS PASS  Glucose, capillary     Status: Abnormal   Collection Time: 02/23/15  4:30 AM  Result Value Ref Range   Glucose-Capillary 171 (H) 65 - 99 mg/dL  Glucose, capillary     Status: Abnormal   Collection Time: 02/23/15  7:41 AM  Result Value Ref Range   Glucose-Capillary 175 (H) 65 - 99 mg/dL   Comment 1 Notify RN    Comment 2 Document in Chart   Basic metabolic panel     Status: Abnormal   Collection Time: 02/23/15  8:41 AM    Result Value Ref Range   Sodium 131 (L) 135 - 145 mmol/L    Comment: DELTA CHECK NOTED   Potassium 3.3 (L) 3.5 - 5.1 mmol/L   Chloride 100 (L) 101 - 111 mmol/L   CO2 24 22 - 32 mmol/L   Glucose, Bld 218 (H) 65 - 99 mg/dL   BUN 5 (L) 6 - 20 mg/dL   Creatinine, Ser 0.65 0.44 - 1.00 mg/dL   Calcium 8.2 (L) 8.9 - 10.3 mg/dL   GFR calc non Af Amer >60 >60 mL/min   GFR calc Af Amer >60 >60 mL/min    Comment: (NOTE) The eGFR has been calculated using the CKD EPI equation. This calculation has not been validated in all clinical situations. eGFR's persistently <60 mL/min signify possible Chronic Kidney Disease.    Anion gap 7 5 - 15  Magnesium     Status: Abnormal   Collection Time: 02/23/15  8:41 AM  Result Value Ref Range   Magnesium 1.5 (L) 1.7 - 2.4 mg/dL  CBC     Status: Abnormal   Collection Time: 02/23/15  8:41 AM  Result Value Ref Range   WBC 14.1 (H) 4.0 - 10.5 K/uL   RBC 3.60 (L) 3.87 - 5.11 MIL/uL   Hemoglobin 11.2 (L) 12.0 - 15.0 g/dL   HCT 32.8 (L) 36.0 - 46.0 %   MCV 91.1 78.0 - 100.0 fL   MCH 31.1 26.0 - 34.0 pg   MCHC 34.1 30.0 - 36.0 g/dL   RDW 12.1 11.5 - 15.5 %   Platelets 218 150 - 400 K/uL  Glucose, capillary     Status: Abnormal   Collection Time: 02/23/15 11:24 AM  Result Value Ref Range   Glucose-Capillary 163 (H) 65 - 99 mg/dL   Comment 1 Notify RN    Comment 2 Document in Chart   Glucose, capillary     Status: Abnormal   Collection Time: 02/23/15  4:40 PM  Result Value Ref Range   Glucose-Capillary 115 (H) 65 - 99 mg/dL   Comment 1 Notify RN    Comment 2 Document in Chart   Glucose, capillary     Status: Abnormal   Collection Time: 02/23/15  8:12 PM  Result Value Ref Range   Glucose-Capillary 141 (H) 65 - 99 mg/dL  Glucose, capillary     Status: Abnormal   Collection Time: 02/24/15 12:28 AM  Result Value Ref Range   Glucose-Capillary 105 (H) 65 - 99 mg/dL  CBC     Status: Abnormal   Collection Time: 02/24/15  4:26 AM  Result Value Ref Range    WBC 9.5 4.0 - 10.5 K/uL   RBC 3.37 (L) 3.87 - 5.11  MIL/uL   Hemoglobin 10.5 (L) 12.0 - 15.0 g/dL   HCT 31.0 (L) 36.0 - 46.0 %   MCV 92.0 78.0 - 100.0 fL   MCH 31.2 26.0 - 34.0 pg   MCHC 33.9 30.0 - 36.0 g/dL   RDW 12.3 11.5 - 15.5 %   Platelets 194 150 - 400 K/uL  Basic metabolic panel     Status: Abnormal   Collection Time: 02/24/15  4:26 AM  Result Value Ref Range   Sodium 141 135 - 145 mmol/L    Comment: DELTA CHECK NOTED   Potassium 3.5 3.5 - 5.1 mmol/L   Chloride 112 (H) 101 - 111 mmol/L   CO2 24 22 - 32 mmol/L   Glucose, Bld 104 (H) 65 - 99 mg/dL   BUN 5 (L) 6 - 20 mg/dL   Creatinine, Ser 0.75 0.44 - 1.00 mg/dL   Calcium 8.1 (L) 8.9 - 10.3 mg/dL   GFR calc non Af Amer >60 >60 mL/min   GFR calc Af Amer >60 >60 mL/min    Comment: (NOTE) The eGFR has been calculated using the CKD EPI equation. This calculation has not been validated in all clinical situations. eGFR's persistently <60 mL/min signify possible Chronic Kidney Disease.    Anion gap 5 5 - 15  Magnesium     Status: None   Collection Time: 02/24/15  4:26 AM  Result Value Ref Range   Magnesium 2.0 1.7 - 2.4 mg/dL  Glucose, capillary     Status: None   Collection Time: 02/24/15  4:35 AM  Result Value Ref Range   Glucose-Capillary 96 65 - 99 mg/dL  Glucose, capillary     Status: None   Collection Time: 02/24/15  7:23 AM  Result Value Ref Range   Glucose-Capillary 83 65 - 99 mg/dL    Lipid Panel No results for input(s): CHOL, TRIG, HDL, CHOLHDL, VLDL, LDLCALC in the last 72 hours.  Studies/Results:  BRAIN MRI W/O CLINICAL DATA: Seizure  EXAM: MRI HEAD WITHOUT CONTRAST  TECHNIQUE: Multiplanar, multiecho pulse sequences of the brain and surrounding structures were obtained without intravenous contrast.  COMPARISON: CT head 02/21/2015  FINDINGS: Image quality degraded by motion  Ventricle size normal. Cerebral volume normal. Cerebellar tonsils extend 4 mm below the foramen magnum. This  is upper normal and does not meet the criteria for Chiari malformation. Pituitary not enlarged  Negative for acute or chronic infarction. Negative for hemorrhage or mass lesion.  Medial temporal lobe normal in signal and volume. Negative for mesial temporal sclerosis.  Mild mucosal edema in the paranasal sinuses. Normal orbit bilaterally.  IMPRESSION: No acute intracranial abnormality. 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    Medications:  Scheduled Meds: . antiseptic oral rinse  7 mL Mouth Rinse QID  . chlorhexidine  15 mL Mouth Rinse BID  . guaiFENesin  1,200 mg Oral BID  . levETIRAcetam  1,000 mg Intravenous Q12H  . pantoprazole (PROTONIX) IV  40 mg Intravenous Daily   Continuous Infusions:  PRN Meds:.acetaminophen, [DISCONTINUED] ondansetron **OR** ondansetron (ZOFRAN) IV, phenol     LOS: 3 days    A. Merlene Laughter, M.D.  Diplomate, [employer] of Psychiatry and Neurology ( Neurology).

## 2015-02-24 NOTE — Progress Notes (Signed)
[facility] PROGRESS NOTE  Gerardo Territo IEP:[id number] DOB: 1975-12-31 DOA: 02/21/2015 PCP: No PCP Per Patient  Assessment/Plan: 1. Seizures with status epilepticus. Patient was seen by neurology, started on Keppra 1 g IV twice a day . EEG was done that did not show any epileptiform discharges. MRI brain did not show any acute findings. She has not had any further seizures. Continue to monitor. No evidence of infection at this time. 2. Acute encephalopathy. Related to seizures. Appears to be improving and returning to baseline. Continue to follow 3. Acute respiratory failure with hypoxia. Patient was intubated in the emergency room for airway protection and recurrent seizures. She was extubated yesterday. She does feel short of breath today. Chest x-ray from yesterday was unremarkable. We'll repeat chest x-ray today. She did report some improvement yesterday with nebulizer treatment. Will continue albuterol nebs. 4. Hyponatremia. Possibly related to dehydration. Improved with IV fluids. 5. Leukocytosis. Likely stress response. Now resolved. No fevers.  Code Status: Full code Family Communication: Discussed with patient and family at the bedside Disposition Plan: Discharge home once improved   Consultants:  Neurology  Pulmonology  Procedures:  EEG: Unremarkable study. No epileptiform discharges.  Intubation 7/11-7/12  Antibiotics:    HPI/Subjective: Complains of sore throat. She does have some worsening pain in her throat with coughing. Does feel mildly short of breath. She knows that she is in the hospital. Denies any chest pain or abdominal pain. No nausea or vomiting. Tolerating clear liquids.  Objective: Filed Vitals:   02/24/15 0800  BP:   Pulse: 75  Temp:   Resp: 13    Intake/Output Summary (Last 24 hours) at 02/24/15 0900 Last data filed at 02/24/15 0500  Gross per 24 hour  Intake    660 ml  Output   5825 ml  Net  -5165 ml   Filed Weights   02/21/15 2130  02/23/15 0500 02/24/15 0500  Weight: 73.3 kg (161 lb 9.6 oz) 77 kg (169 lb 12.1 oz) 80.9 kg (178 lb 5.6 oz)    Exam:   General:  NAD, sitting up in bed, flat affect  Cardiovascular: s1, s2, rrr  Respiratory: clear bilaterally, has some increased work of breathing  Abdomen: soft, nt, nd, bs+  Musculoskeletal:  No edema b/l   Data Reviewed: Basic Metabolic Panel:  Recent Labs Lab 02/21/15 1408 02/22/15 0523 02/23/15 0841 02/24/15 0426  NA 140 138 131* 141  K 3.6 3.8 3.3* 3.5  CL 110 110 100* 112*  CO2 24 24 24 24   GLUCOSE 90 92 218* 104*  BUN 14 11 5* 5*  CREATININE 0.81 0.64 0.65 0.75  CALCIUM 9.4 8.4* 8.2* 8.1*  MG  --  1.8 1.5* 2.0   Liver Function Tests:  Recent Labs Lab 02/21/15 1408 02/22/15 0523  AST 35 27  ALT 27 21  ALKPHOS 53 47  BILITOT 0.7 1.0  PROT 7.3 6.0*  ALBUMIN 4.3 3.4*   No results for input(s): LIPASE, AMYLASE in the last 168 hours. No results for input(s): AMMONIA in the last 168 hours. CBC:  Recent Labs Lab 02/21/15 1408 02/22/15 0523 02/23/15 0841 02/24/15 0426  WBC 6.6 5.3 14.1* 9.5  NEUTROABS 4.1  --   --   --   HGB 12.7 11.2* 11.2* 10.5*  HCT 37.7 33.1* 32.8* 31.0*  MCV 90.8 92.2 91.1 92.0  PLT 250 220 218 194   Cardiac Enzymes: No results for input(s): CKTOTAL, CKMB, CKMBINDEX, TROPONINI in the last 168 hours. BNP (last 3 results) No  results for input(s): BNP in the last 8760 hours.  ProBNP (last 3 results) No results for input(s): PROBNP in the last 8760 hours.  CBG:  Recent Labs Lab 02/23/15 1640 02/23/15 2012 02/24/15 0028 02/24/15 0435 02/24/15 0723  GLUCAP 115* 141* 105* 96 83    Recent Results (from the past 240 hour(s))  MRSA PCR Screening     Status: Abnormal   Collection Time: 02/22/15  9:34 AM  Result Value Ref Range Status   MRSA by PCR INVALID RESULTS, SPECIMEN SENT FOR CULTURE (A) NEGATIVE Final    Comment: Results Called to: HILTON L. AT 1245P ON [id number] BY THOMPSON S.        The  GeneXpert MRSA Assay (FDA approved for NASAL specimens only), is one component of a comprehensive MRSA colonization surveillance program. It is not intended to diagnose MRSA infection nor to guide or monitor treatment for MRSA infections.   MRSA culture     Status: None   Collection Time: 02/22/15  9:34 AM  Result Value Ref Range Status   Specimen Description NOSE  Final   Special Requests NONE  Final   Culture NOMRSA Performed at [facility]   Final   Report Status 02/24/2015 FINAL  Final     Studies: Mr Brain Wo Contrast  02/23/2015   CLINICAL DATA:  Seizure  EXAM: MRI HEAD WITHOUT CONTRAST  TECHNIQUE: Multiplanar, multiecho pulse sequences of the brain and surrounding structures were obtained without intravenous contrast.  COMPARISON:  CT head 02/21/2015  FINDINGS: Image quality degraded by motion  Ventricle size normal. Cerebral volume normal. Cerebellar tonsils extend 4 mm below the foramen magnum. This is upper normal and does not meet the criteria for Chiari malformation. Pituitary not enlarged  Negative for acute or chronic infarction. Negative for hemorrhage or mass lesion.  Medial temporal lobe normal in signal and volume. Negative for mesial temporal sclerosis.  Mild mucosal edema in the paranasal sinuses. Normal orbit bilaterally.  IMPRESSION: No acute intracranial abnormality. 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  Chronic sinusitis.   Electronically Signed   By: Franchot Gallo M.D.   On: 02/23/2015 18:58   Dg Chest Port 1 View  02/23/2015   CLINICAL DATA:  Hypoxia  EXAM: PORTABLE CHEST - 1 VIEW  COMPARISON:  February 22, 2015  FINDINGS: Endotracheal tube tip is 3.2 cm above the carina. No pneumothorax. The lungs are clear. The heart size and pulmonary vascularity are normal. No adenopathy. No bone lesions.  IMPRESSION: Endotracheal tube as described without pneumothorax. No edema or consolidation.   Electronically Signed   By: Lowella Grip III  M.D.   On: 02/23/2015 07:57   Dg Chest Port 1 View  02/22/2015   CLINICAL DATA:  Endotracheal tube placement.  Seizure.  EXAM: PORTABLE CHEST - 1 VIEW  COMPARISON:  None.  FINDINGS: Endotracheal tube is roughly 2 cm above the carina. Lungs are clear. Negative for a pneumothorax. Heart and mediastinum are within normal limits. No acute bone abnormality.  IMPRESSION: Endotracheal tube is appropriately positioned. No focal lung disease.   Electronically Signed   By: Markus Daft M.D.   On: 02/22/2015 09:46    Scheduled Meds: . antiseptic oral rinse  7 mL Mouth Rinse QID  . chlorhexidine  15 mL Mouth Rinse BID  . levETIRAcetam  1,000 mg Intravenous Q12H  . pantoprazole (PROTONIX) IV  40 mg Intravenous Daily   Continuous Infusions:   Principal Problem:   Seizure Active Problems:  Acute encephalopathy   Acute respiratory failure with hypoxia   Hyponatremia   Status epilepticus    Time spent: 4mins    MEMON,JEHANZEB  [facility] Pager [phone]. If 7PM-7AM, please contact night-coverage at [email or web address], password [facility] 02/24/2015, 9:00 AM  LOS: 3 days

## 2015-02-24 NOTE — Progress Notes (Signed)
Walked 200 feet with help. Oxygen saturations remained 100% and heart rate remained 90-95 during and after walk.

## 2015-02-24 NOTE — Progress Notes (Signed)
She was successfully extubated yesterday and is doing well from a respiratory point of view. She does have some complaints of sore throat but she has Chloraseptic ordered and it seems to have helped some. She still seems a little encephalopathic although I do not know what her baseline is.  Since she doesn't have any ongoing respiratory problems I will plan to sign off. Thanks for allowing me to see her with you

## 2015-02-24 NOTE — Consult Note (Signed)
Megan Harvey, [employer]                 ACCOUNT NO.:  [id number]  MEDICAL RECORD NO.:  [id number]  LOCATION:  IC06                          FACILITY:  [facility]  PHYSICIAN:   L. Luan Pulling, M.D.DATE OF BIRTH:  Nov 05, 1975  DATE OF CONSULTATION: DATE OF DISCHARGE:                                CONSULTATION   ADDENDUM  Ms. Fix is a 39 year old, who had a remote history of seizure disorder and who was in church on the day of admission and had a seizure of some sort.  Family members who are with her today say that she did not have any jerking but her eyes rolled to the back of her head, she began making some snoring and snorting noises and collapsed.  She did not lose control of her urine as far as they could tell.  She was brought to the emergency department and was admitted from there.  She then had a series of seizures and eventually had to be intubated and placed on mechanical ventilation for airway protection.  She is still intubated and on the ventilator at this time.  She is unresponsive and unable to give any history.  The history from the medical record shows that she has some sort of a history of seizures, but all of that is not totally clear. She does not have any medical problems.  No lung problems.  She has not had any surgery.  SOCIAL HISTORY:  She is a nonsmoker.  FAMILY HISTORY:  Positive for seizures.  ALLERGIC:  She is allergic to penicillin and bee venom.  MEDICATIONS:  She is on no medications.  PHYSICAL EXAMINATION:  GENERAL:  She is intubated, sedated, and on the ventilator. HEENT:  Her pupils are reactive.  Nose and throat are clear.  Mucous membranes are moist. CHEST:  Shows normal breath sounds and good air movement. HEART:  Regular without gallop. ABDOMEN:  Soft.  No masses are felt. EXTREMITIES:  Showed no edema.  ASSESSMENT:  She has had what appears to be status epilepticus and had to be intubated for airway protection.  Since she does not have any  lung issues, she should probably be able to come off the ventilator fairly rapidly once her seizure disorder is controlled.  Thanks for allowing me to see her with you.      L. Luan Pulling, M.D.     ELH/MEDQ  D:  02/23/2015  T:  02/24/2015  Job:  [id number]

## 2015-02-25 ENCOUNTER — Encounter (HOSPITAL_COMMUNITY): Payer: Self-pay | Admitting: Emergency Medicine

## 2015-02-25 LAB — GLUCOSE, CAPILLARY: Glucose-Capillary: 90 mg/dL (ref 65–99)

## 2015-02-25 LAB — BASIC METABOLIC PANEL
ANION GAP: 7 (ref 5–15)
BUN: 5 mg/dL — AB (ref 6–20)
CO2: 25 mmol/L (ref 22–32)
CREATININE: 0.71 mg/dL (ref 0.44–1.00)
Calcium: 8.8 mg/dL — ABNORMAL LOW (ref 8.9–10.3)
Chloride: 108 mmol/L (ref 101–111)
GFR calc Af Amer: >60 mL/min (ref >60)
GFR calc non Af Amer: >60 mL/min (ref >60)
Glucose, Bld: 104 mg/dL — ABNORMAL HIGH (ref 65–99)
Potassium: 3.3 mmol/L — ABNORMAL LOW (ref 3.5–5.1)
Sodium: 140 mmol/L (ref 135–145)

## 2015-02-25 LAB — CBC
HEMATOCRIT: 32.7 % — AB (ref 36.0–46.0)
Hemoglobin: 11 g/dL — ABNORMAL LOW (ref 12.0–15.0)
MCH: 31 pg (ref 26.0–34.0)
MCHC: 33.6 g/dL (ref 30.0–36.0)
MCV: 92.1 fL (ref 78.0–100.0)
Platelets: 221 10*3/uL (ref 150–400)
RBC: 3.55 MIL/uL — ABNORMAL LOW (ref 3.87–5.11)
RDW: 12.2 % (ref 11.5–15.5)
WBC: 8.1 10*3/uL (ref 4.0–10.5)

## 2015-02-25 MED ORDER — LEVETIRACETAM 500 MG PO TABS
1000.0000 mg | ORAL_TABLET | Freq: Two times a day (BID) | ORAL | Status: DC
  Administered 2015-02-25: 1000 mg via ORAL
  Filled 2015-02-25: qty 2

## 2015-02-25 MED ORDER — PANTOPRAZOLE SODIUM 40 MG PO TBEC: 40.0000 mg | DELAYED_RELEASE_TABLET | Freq: Every day | ORAL | Status: DC

## 2015-02-25 MED ORDER — LEVETIRACETAM 1000 MG PO TABS: 1000.0000 mg | ORAL_TABLET | Freq: Two times a day (BID) | ORAL | Status: DC

## 2015-02-25 NOTE — Discharge Summary (Signed)
Physician Discharge Summary  Megan Harvey XBJ:[id number] DOB: [birth year] DOA: 02/21/2015  PCP: No PCP Per Patient  Admit date: 02/21/2015 Discharge date: 02/25/2015  Time spent: 40 minutes  Recommendations for Outpatient Follow-up:  1. Follow-up with neurology in 4 weeks. No driving until cleared by neurology  Discharge Diagnoses:  Principal Problem:   Seizure Active Problems:   Acute encephalopathy   Acute respiratory failure with hypoxia   Hyponatremia   Status epilepticus   Discharge Condition: Improved  Diet recommendation: Regular diet  Filed Weights   02/23/15 0500 02/24/15 0500 02/25/15 0500  Weight: 77 kg (169 lb 12.1 oz) 80.9 kg (178 lb 5.6 oz) 74.4 kg (164 lb 0.4 oz)    History of present illness:  This is a 39 year old lady who was brought to the emergency room after having a seizure. She was apparently at church when she suffered a seizure. She was subsequently postictal and brought to the emergency room. Patient was admitted to the hospital for further workup.  Hospital Course:  After admission, patient began to have further seizures. It was felt that she was not protecting her airway and therefore she was intubated and transferred to the ICU. EEG was done which was found to be an unremarkable study. MRI brain did not show any significant findings. Patient was started on intravenous Keppra with resolution of her seizures. She is followed by pulmonology for that management as well as neurology. Patient was extubated without difficulty. She did not have any further seizures after extubation. She's been monitored in the hospital and felt appropriate for discharge. She'll be continued on Keppra and follow up with neurology. She is advised not to drive, or operate heavy machinery. The remainder of her blood work appears to be unremarkable.  Procedures:  EEG: Unremarkable study. No epileptiform discharges.  Intubation  7/11-7/12  Consultations:  Pulmonology  Neurology  Discharge Exam: Filed Vitals:   02/25/15 0600  BP: 125/80  Pulse: 72  Temp:   Resp:     General: NAD Cardiovascular: s1, s2, rrr Respiratory: cta b  Discharge Instructions   Discharge Instructions    Diet general    Complete by:  As directed      Driving Restrictions    Complete by:  As directed   No driving until cleared by neurologist Do not be unattended in standing water No operating heavy machinery unattended.     Increase activity slowly    Complete by:  As directed           Discharge Medication List as of 02/25/2015 12:44 PM    START taking these medications   Details  levETIRAcetam (KEPPRA) 1000 MG tablet Take 1 tablet (1,000 mg total) by mouth 2 (two) times daily., Starting 02/25/2015, Until Discontinued, Print       Allergies  Allergen Reactions  . Bee Venom Swelling  . Penicillins Hives  . Penicillins Other (See Comments)    Unknown reaction      The results of significant diagnostics from this hospitalization (including imaging, microbiology, ancillary and laboratory) are listed below for reference.    Significant Diagnostic Studies: Ct Head Wo Contrast  02/21/2015   CLINICAL DATA:  Seizure.  EXAM: CT HEAD WITHOUT CONTRAST  TECHNIQUE: Contiguous axial images were obtained from the base of the skull through the vertex without intravenous contrast.  COMPARISON:  None.  FINDINGS: No evidence of intracranial hemorrhage, brain edema, or other signs of acute infarction. No evidence of intracranial mass lesion or mass effect.  No  abnormal extraaxial fluid collections identified. Ventricles are normal in size. No skull abnormality identified.  IMPRESSION: Negative noncontrast head CT.   Electronically Signed   By: Earle Gell M.D.   On: 02/21/2015 15:23   Mr Brain Wo Contrast  02/23/2015   CLINICAL DATA:  Seizure  EXAM: MRI HEAD WITHOUT CONTRAST  TECHNIQUE: Multiplanar, multiecho pulse sequences of the  brain and surrounding structures were obtained without intravenous contrast.  COMPARISON:  CT head 02/21/2015  FINDINGS: Image quality degraded by motion  Ventricle size normal. Cerebral volume normal. Cerebellar tonsils extend 4 mm below the foramen magnum. This is upper normal and does not meet the criteria for Chiari malformation. Pituitary not enlarged  Negative for acute or chronic infarction. Negative for hemorrhage or mass lesion.  Medial temporal lobe normal in signal and volume. Negative for mesial temporal sclerosis.  Mild mucosal edema in the paranasal sinuses. Normal orbit bilaterally.  IMPRESSION: No acute intracranial abnormality. 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  Chronic sinusitis.   Electronically Signed   By: Franchot Gallo M.D.   On: 02/23/2015 18:58   Dg Chest Port 1 View  02/24/2015   CLINICAL DATA:  Shortness of breath. History of seizures. Initial encounter.  EXAM: PORTABLE CHEST - 1 VIEW  COMPARISON:  02/22/2015 and 02/23/2015.  FINDINGS: 1158 hours. Interval extubation. The heart size and mediastinal contours are stable allowing for mild rotation. The lungs are clear. There is no pleural effusion or pneumothorax. The bones appear unremarkable. Telemetry leads overlie the chest.  IMPRESSION: No active cardiopulmonary process following extubation.   Electronically Signed   By: Richardean Sale M.D.   On: 02/24/2015 12:18   Dg Chest Port 1 View  02/23/2015   CLINICAL DATA:  Hypoxia  EXAM: PORTABLE CHEST - 1 VIEW  COMPARISON:  February 22, 2015  FINDINGS: Endotracheal tube tip is 3.2 cm above the carina. No pneumothorax. The lungs are clear. The heart size and pulmonary vascularity are normal. No adenopathy. No bone lesions.  IMPRESSION: Endotracheal tube as described without pneumothorax. No edema or consolidation.   Electronically Signed   By: Lowella Grip III M.D.   On: 02/23/2015 07:57   Dg Chest Port 1 View  02/22/2015   CLINICAL DATA:  Endotracheal tube  placement.  Seizure.  EXAM: PORTABLE CHEST - 1 VIEW  COMPARISON:  None.  FINDINGS: Endotracheal tube is roughly 2 cm above the carina. Lungs are clear. Negative for a pneumothorax. Heart and mediastinum are within normal limits. No acute bone abnormality.  IMPRESSION: Endotracheal tube is appropriately positioned. No focal lung disease.   Electronically Signed   By: Markus Daft M.D.   On: 02/22/2015 09:46    Microbiology: Recent Results (from the past 240 hour(s))  MRSA PCR Screening     Status: Abnormal   Collection Time: 02/22/15  9:34 AM  Result Value Ref Range Status   MRSA by PCR INVALID RESULTS, SPECIMEN SENT FOR CULTURE (A) NEGATIVE Final    Comment: Results Called to: HILTON L. AT 1245P ON [id number] BY THOMPSON S.        The GeneXpert MRSA Assay (FDA approved for NASAL specimens only), is one component of a comprehensive MRSA colonization surveillance program. It is not intended to diagnose MRSA infection nor to guide or monitor treatment for MRSA infections.   MRSA culture     Status: None   Collection Time: 02/22/15  9:34 AM  Result Value Ref Range Status   Specimen Description NOSE  Final  Special Requests NONE  Final   Culture NOMRSA Performed at [facility]   Final   Report Status 02/24/2015 FINAL  Final     Labs: Basic Metabolic Panel:  Recent Labs Lab 02/21/15 1408 02/22/15 0523 02/23/15 0841 02/24/15 0426 02/25/15 0426  NA 140 138 131* 141 140  K 3.6 3.8 3.3* 3.5 3.3*  CL 110 110 100* 112* 108  CO2 24 24 24 24 25   GLUCOSE 90 92 218* 104* 104*  BUN 14 11 5* 5* 5*  CREATININE 0.81 0.64 0.65 0.75 0.71  CALCIUM 9.4 8.4* 8.2* 8.1* 8.8*  MG  --  1.8 1.5* 2.0  --    Liver Function Tests:  Recent Labs Lab 02/21/15 1408 02/22/15 0523  AST 35 27  ALT 27 21  ALKPHOS 53 47  BILITOT 0.7 1.0  PROT 7.3 6.0*  ALBUMIN 4.3 3.4*   No results for input(s): LIPASE, AMYLASE in the last 168 hours. No results for input(s): AMMONIA in the last 168  hours. CBC:  Recent Labs Lab 02/21/15 1408 02/22/15 0523 02/23/15 0841 02/24/15 0426 02/25/15 0426  WBC 6.6 5.3 14.1* 9.5 8.1  NEUTROABS 4.1  --   --   --   --   HGB 12.7 11.2* 11.2* 10.5* 11.0*  HCT 37.7 33.1* 32.8* 31.0* 32.7*  MCV 90.8 92.2 91.1 92.0 92.1  PLT 250 220 218 194 221   Cardiac Enzymes: No results for input(s): CKTOTAL, CKMB, CKMBINDEX, TROPONINI in the last 168 hours. BNP: BNP (last 3 results) No results for input(s): BNP in the last 8760 hours.  ProBNP (last 3 results) No results for input(s): PROBNP in the last 8760 hours.  CBG:  Recent Labs Lab 02/23/15 2012 02/24/15 0028 02/24/15 0435 02/24/15 0723 02/25/15 0834  GLUCAP 141* 105* 96 83 90       Signed:  ,  [facility] 02/25/2015, 7:14 PM

## 2015-02-25 NOTE — Discharge Planning (Signed)
Pt IV and tele removed. VSS and RN assessment reveals stability.  Pt given dc papers, explained and educated. Told of FU suggested with PCP and script at pharm. Family confirmed needed payment and stated they could afford for patient use. Pt educated to always inform PCP is having difficulty getting meds, rather than just stopping to take. FMLA papers filled out by [facility] and faxed as family requested - confirmation of transmission received and papers returned to family. Patient will be wheeled to car and family will be transporting home.

## 2015-02-25 NOTE — Progress Notes (Signed)
The patient is receiving Protonix by the intravenous route.  Based on criteria approved by the Pharmacy and [place], the medication is being converted to the equivalent oral dose form.  These criteria include: -No Active GI bleeding -Able to tolerate diet of full liquids (or better) or tube feeding OR able to tolerate other medications by the oral or enteral route  If you have any questions about this conversion, please contact the Pharmacy Department (ext [phone]).  Thank you.  Biagio Borg, [facility] 02/25/2015 10:46 AM

## 2015-02-25 NOTE — Care Management Note (Signed)
Case Management Note  Patient Details  Name: Devri Kreher MRN: [id number] Date of Birth: 06-16-1976  Expected Discharge Date:                  Expected Discharge Plan:  Home/Self Care  In-House Referral:  NA  Discharge planning Services  CM Consult  Post Acute Care Choice:    Choice offered to:  NA  DME Arranged:    DME Agency:     HH Arranged:    [place]:     Status of Service:  Completed, signed off  Medicare Important Message Given:    Date Medicare IM Given:    Medicare IM give by:    Date Additional Medicare IM Given:    Additional Medicare Important Message give by:     If discussed at [place] of Stay Meetings, dates discussed:    Additional Comments: Pt discharging home today with self care. No CM needs at the time of discharge.   Sherald Barge, RN 02/25/2015, 12:52 PM

## 2015-03-22 ENCOUNTER — Ambulatory Visit: Payer: Self-pay | Admitting: Family Medicine

## 2015-03-23 ENCOUNTER — Encounter: Payer: Self-pay | Admitting: Family Medicine

## 2015-03-23 ENCOUNTER — Ambulatory Visit (INDEPENDENT_AMBULATORY_CARE_PROVIDER_SITE_OTHER): Payer: PRIVATE HEALTH INSURANCE | Admitting: Family Medicine

## 2015-03-23 VITALS — BP 118/66 | HR 82 | Temp 98.4°F | Resp 16 | Ht 62.0 in | Wt 159.0 lb

## 2015-03-23 DIAGNOSIS — Z124 Encounter for screening for malignant neoplasm of cervix: Secondary | ICD-10-CM

## 2015-03-23 DIAGNOSIS — Z833 Family history of diabetes mellitus: Secondary | ICD-10-CM | POA: Diagnosis not present

## 2015-03-23 DIAGNOSIS — R569 Unspecified convulsions: Secondary | ICD-10-CM | POA: Diagnosis not present

## 2015-03-23 DIAGNOSIS — Z0000 Encounter for general adult medical examination without abnormal findings: Secondary | ICD-10-CM | POA: Diagnosis not present

## 2015-03-23 LAB — CBC WITH DIFFERENTIAL/PLATELET
BASOS PCT: 1 % (ref 0–1)
Basophils Absolute: 0.1 10*3/uL (ref 0.0–0.1)
EOS ABS: 0.5 10*3/uL (ref 0.0–0.7)
Eosinophils Relative: 8 % — ABNORMAL HIGH (ref 0–5)
HCT: 38 % (ref 36.0–46.0)
Hemoglobin: 12.5 g/dL (ref 12.0–15.0)
Lymphocytes Relative: 18 % (ref 12–46)
Lymphs Abs: 1.1 10*3/uL (ref 0.7–4.0)
MCH: 30.2 pg (ref 26.0–34.0)
MCHC: 32.9 g/dL (ref 30.0–36.0)
MCV: 91.8 fL (ref 78.0–100.0)
MONOS PCT: 10 % (ref 3–12)
MPV: 9.7 fL (ref 8.6–12.4)
Monocytes Absolute: 0.6 10*3/uL (ref 0.1–1.0)
NEUTROS ABS: 3.7 10*3/uL (ref 1.7–7.7)
Neutrophils Relative %: 63 % (ref 43–77)
Platelets: 276 10*3/uL (ref 150–400)
RBC: 4.14 MIL/uL (ref 3.87–5.11)
RDW: 12.8 % (ref 11.5–15.5)
WBC: 5.9 10*3/uL (ref 4.0–10.5)

## 2015-03-23 LAB — TSH: TSH: 0.829 u[IU]/mL (ref 0.350–4.500)

## 2015-03-23 MED ORDER — LEVETIRACETAM 1000 MG PO TABS: 1000.0000 mg | ORAL_TABLET | Freq: Two times a day (BID) | ORAL | Status: DC

## 2015-03-23 NOTE — Patient Instructions (Addendum)
Release of records- Dr.Bluth-- [place]  Continue current medications  Try the cepacol throat lozenges Use salt water gargle Try not to clear your throat We will call with lab results F/U as needed

## 2015-03-23 NOTE — Assessment & Plan Note (Signed)
Refilled keppra, f/u with neurology, she would like to return to work, will see what neurologist has to say, discussed she will likley not be able to drive for next 6 months

## 2015-03-23 NOTE — Progress Notes (Signed)
Patient ID: Megan Harvey, female   DOB: 05/22/1976, 39 y.o.   MRN: [id number]   Subjective:    Patient ID: Megan Harvey, female    DOB: 03-Jan-1976, 39 y.o.   MRN: [id number]  Patient presents for New Patient CPE with PAP  Pt here to establish care. Previous PCP Dr. Wenda Overland in [place]. I currently see her husband. Her past medical history was fairly unremarkable until last month when she was diagnosed with epilepsy. She had multiple seizures which were witnessed. Initially started at church she did not have any illness prior to. She was intubated at the hospital secondary to recurrent seizure she was placed on Keppra and has been maintained on this. She has follow-up with neurology this Friday to establish care. I reviewed hospital discharge her MRI was normal her EEG was unremarkable drug screen was normal. She was found to be hyponatremic on her labs.  She's not had any seizures since she has been on the [place]  She has 2 children a 13-year-old and a 64 year old   family history medications reviewed. She does have significant family history for coronary artery disease as well as diabetes mellitus. She tries to monitor her weight she also takes Herbal[employer] supplements which was last taken prior to her seizure.   last Pap smear about years ago. No history of any abnormal Pap smear  Review Of Systems:  GEN- denies fatigue, fever, weight loss,weakness, recent illness HEENT- denies eye drainage, change in vision, nasal discharge, CVS- denies chest pain, palpitations RESP- denies SOB, cough, wheeze ABD- denies N/V, change in stools, abd pain GU- denies dysuria, hematuria, dribbling, incontinence MSK- denies joint pain, muscle aches, injury Neuro- denies headache, dizziness, syncope, +seizure activity       Objective:    BP 118/66 mmHg  Pulse 82  Temp(Src) 98.4 F (36.9 C) (Oral)  Resp 16  Ht 5\' 2"  (1.575 m)  Wt 159 lb (72.122 kg)  BMI 29.07 kg/m2  LMP 03/07/2015 (Approximate) GEN- NAD, alert and  oriented x3 HEENT- PERRL, EOMI, non injected sclera, pink conjunctiva, MMM, oropharynx clear Neck- Supple, no thyromegaly CVS- RRR, no murmur RESP-CTAB Breast- normal symmetry, no nipple inversion,no nipple drainage, no nodules or lumps felt Nodes- no axillary nodes ABD-NABS,soft,NT,ND GU- normal external genitalia, vaginal mucosa pink and moist, cervix visualized no growth, no blood form os, minimal thin clear discharge, no CMT, no ovarian masses, uterus normal size Psych- normal affect and mood EXT- No edema Pulses- Radial, DP- 2+        Assessment & Plan:      Problem List Items Addressed This Visit    Seizure   Relevant Medications   levETIRAcetam (KEPPRA) 1000 MG tablet    Other Visit Diagnoses    Routine general medical examination at a health care facility    -  Primary    CPE done, immunizations UTD, fasting labs today family history of CAD/DM,    Relevant Orders    CBC with Differential/Platelet    Comprehensive metabolic panel    Lipid panel    TSH    Family history of diabetes mellitus        Cervical cancer screening        Relevant Orders    PAP, ThinPrep ASCUS Rflx HPV Rflx Type       Note: This dictation was prepared with Dragon dictation along with smaller phrase technology. Any transcriptional errors that result from this process are unintentional.

## 2015-03-24 ENCOUNTER — Encounter: Payer: Self-pay | Admitting: Family Medicine

## 2015-03-24 ENCOUNTER — Encounter: Payer: Self-pay | Admitting: *Deleted

## 2015-03-24 DIAGNOSIS — J45909 Unspecified asthma, uncomplicated: Secondary | ICD-10-CM | POA: Insufficient documentation

## 2015-03-24 DIAGNOSIS — C801 Malignant (primary) neoplasm, unspecified: Secondary | ICD-10-CM | POA: Insufficient documentation

## 2015-03-24 DIAGNOSIS — T7840XA Allergy, unspecified, initial encounter: Secondary | ICD-10-CM | POA: Insufficient documentation

## 2015-03-24 LAB — COMPREHENSIVE METABOLIC PANEL
ALBUMIN: 4 g/dL (ref 3.6–5.1)
ALK PHOS: 59 U/L (ref 33–115)
ALT: 12 U/L (ref 6–29)
AST: 14 U/L (ref 10–30)
BUN: 9 mg/dL (ref 7–25)
CHLORIDE: 104 mmol/L (ref 98–110)
CO2: 25 mmol/L (ref 20–31)
CREATININE: 0.73 mg/dL (ref 0.50–1.10)
Calcium: 9.5 mg/dL (ref 8.6–10.2)
GLUCOSE: 83 mg/dL (ref 70–99)
Potassium: 4.3 mmol/L (ref 3.5–5.3)
SODIUM: 141 mmol/L (ref 135–146)
Total Bilirubin: 0.4 mg/dL (ref 0.2–1.2)
Total Protein: 6.9 g/dL (ref 6.1–8.1)

## 2015-03-24 LAB — LIPID PANEL
CHOLESTEROL: 155 mg/dL (ref 125–200)
HDL: 67 mg/dL (ref >=46)
LDL Cholesterol: 79 mg/dL (ref <130)
TRIGLYCERIDES: 46 mg/dL (ref <150)
Total CHOL/HDL Ratio: 2.3 Ratio (ref <=5.0)
VLDL: 9 mg/dL (ref <30)

## 2015-03-24 LAB — PAP THINPREP ASCUS RFLX HPV RFLX TYPE

## 2015-03-26 ENCOUNTER — Ambulatory Visit (INDEPENDENT_AMBULATORY_CARE_PROVIDER_SITE_OTHER): Payer: PRIVATE HEALTH INSURANCE | Admitting: Diagnostic Neuroimaging

## 2015-03-26 ENCOUNTER — Encounter: Payer: Self-pay | Admitting: Diagnostic Neuroimaging

## 2015-03-26 VITALS — BP 99/61 | HR 70 | Ht 62.0 in | Wt 160.0 lb

## 2015-03-26 DIAGNOSIS — G40909 Epilepsy, unspecified, not intractable, without status epilepticus: Secondary | ICD-10-CM | POA: Diagnosis not present

## 2015-03-26 MED ORDER — LEVETIRACETAM 1000 MG PO TABS: 1000.0000 mg | ORAL_TABLET | Freq: Two times a day (BID) | ORAL | Status: DC

## 2015-03-26 NOTE — Patient Instructions (Signed)
-   Continue levetiracetam 1000mg  twice a day.  - Try goodrx.com for discount on medications.  - According to [place] law, you can not drive unless you are seizure free for at least 6 months and under physician's care.   - Please maintain seizure precautions. Do not participate in activities where a loss of awareness could harm you or someone else. No swimming alone, no tub bathing, no hot tubs, no driving, no operating motorized vehicles (cars, ATVs, motocycles, etc), lawnmowers or power tools. No standing at heights, such as rooftops, ladders or stairs. Avoid hot objects such as stoves, heaters, open fires. Wear a helmet when riding a bicycle, scooter, skateboard, etc. and avoid areas of traffic. Set your water heater to 120 degrees or less.

## 2015-03-26 NOTE — Progress Notes (Signed)
[facility]  PATIENT: Megan Harvey DOB: 1976/01/19  REFERRING CLINICIAN: J Memon HISTORY FROM: patient and husband  REASON FOR VISIT: new consult   HISTORICAL  CHIEF COMPLAINT:  Chief Complaint  Patient presents with  . Follow-up    seizure    HISTORY OF PRESENT ILLNESS:   39 year old right-handed female here for evaluation of seizure. 02/21/15 patient was at church, felt hot and sweaty and slumped to the ground. Patient's husband tried to stand her up and then she had generalized convulsions. Her eyes were rolling back, arms out in front of her then generalized body convulsions. She may have had minor tongue biting. No incontinence. Patient was taken to the hospital and admitted for seizure workup. The next day patient had a second seizure resulting in status epilepticus. This required intubation and ICU admission/transfer. Patient was treated with antiseizure medication. Within next day she was able to be extubated. MRI of the brain and EEG were unremarkable. Patient was discharged on levetiracetam 1000 mg twice a day.  Since that time patient is doing well. No problems with medication side effects. She is not driving as per recommended driving restrictions. No prior similar symptoms. No history of trauma, meningitis encephalitis. Patient has family history of seizure in her brother who is a few years under than her, and was had seizures since age 48 years old until present time.    REVIEW OF SYSTEMS: Full 14 system review of systems performed and notable only for cramps diarrhea.  ALLERGIES: Allergies  Allergen Reactions  . Bee Venom Swelling  . Penicillins Hives  . Penicillins Other (See Comments)    Unknown reaction    HOME MEDICATIONS: Outpatient Prescriptions Prior to Visit  Medication Sig Dispense Refill  . levETIRAcetam (KEPPRA) 1000 MG tablet Take 1 tablet (1,000 mg total) by mouth 2 (two) times daily. 60 tablet 1   No facility-administered  medications prior to visit.    PAST MEDICAL HISTORY: Past Medical History  Diagnosis Date  . Seizures   . Allergy   . Asthma     childhood  . Cancer     PAST SURGICAL HISTORY: Past Surgical History  Procedure Laterality Date  . Bilateral carpal tunnel release      FAMILY HISTORY: Family History  Problem Relation Age of Onset  . Heart disease Mother   . Hypertension Mother   . Diabetes Mother   . Arthritis Mother   . Hyperlipidemia Mother   . Heart disease Father   . Hypertension Father   . Diabetes Father   . Diabetes Maternal Grandmother   . Diabetes Paternal Grandmother   . Cancer Paternal Grandmother   . Alcohol abuse Sister   . Alcohol abuse Brother   . Alcohol abuse Daughter   . Alcohol abuse Brother     SOCIAL HISTORY:  Social History   Social History  . Marital Status: Married    Spouse Name: Elberta Fortis  . Number of Children: 2  . Years of Education: 12th   Occupational History  . other     [employer]   Social History Main Topics  . Smoking status: Never Smoker   . Smokeless tobacco: Never Used  . Alcohol Use: No  . Drug Use: No  . Sexual Activity: Yes    Birth Control/ Protection: None   Other Topics Concern  . Not on file   Social History Narrative   ** Merged History Encounter **   Patient lives at home with spouse.   Caffeine  Use: none         PHYSICAL EXAM  GENERAL EXAM/CONSTITUTIONAL: Vitals:  Filed Vitals:   03/26/15 1115  BP: 99/61  Pulse: 70  Height: 5\' 2"  (1.575 m)  Weight: 160 lb (72.576 kg)     Body mass index is 29.26 kg/(m^2).  Visual Acuity Screening   Right eye Left eye Both eyes  Without correction: 20/30 20/30   With correction:        Patient is in no distress; well developed, nourished and groomed; neck is supple  CARDIOVASCULAR:  Examination of carotid arteries is normal; no carotid bruits  Regular rate and rhythm, no murmurs  Examination of peripheral vascular system by observation and  palpation is normal  EYES:  Ophthalmoscopic exam of optic discs and posterior segments is normal; no papilledema or hemorrhages  MUSCULOSKELETAL:  Gait, strength, tone, movements noted in Neurologic exam below  NEUROLOGIC: MENTAL STATUS:  No flowsheet data found.  awake, alert, oriented to person, place and time  recent and remote memory intact  normal attention and concentration  language fluent, comprehension intact, naming intact,   fund of knowledge appropriate  CRANIAL NERVE:   2nd - no papilledema on fundoscopic exam  2nd, 3rd, 4th, 6th - pupils equal and reactive to light, visual fields full to confrontation, extraocular muscles intact, no nystagmus  5th - facial sensation symmetric  7th - facial strength symmetric  8th - hearing intact  9th - palate elevates symmetrically, uvula midline  11th - shoulder shrug symmetric  12th - tongue protrusion midline  MOTOR:   normal bulk and tone, full strength in the BUE, BLE  SENSORY:   normal and symmetric to light touch, temperature, vibration    COORDINATION:   finger-nose-finger, fine finger movements normal  REFLEXES:   deep tendon reflexes present and symmetric  GAIT/STATION:   narrow based gait; able to walk tandem; romberg is negative    DIAGNOSTIC DATA (LABS, IMAGING, TESTING) - I reviewed patient records, labs, notes, testing and imaging myself where available.  Lab Results  Component Value Date   WBC 5.9 03/23/2015   HGB 12.5 03/23/2015   HCT 38.0 03/23/2015   MCV 91.8 03/23/2015   PLT 276 03/23/2015      Component Value Date/Time   NA 141 03/23/2015 1050   K 4.3 03/23/2015 1050   CL 104 03/23/2015 1050   CO2 25 03/23/2015 1050   GLUCOSE 83 03/23/2015 1050   BUN 9 03/23/2015 1050   CREATININE 0.73 03/23/2015 1050   CREATININE 0.71 02/25/2015 0426   CALCIUM 9.5 03/23/2015 1050   PROT 6.9 03/23/2015 1050   ALBUMIN 4.0 03/23/2015 1050   AST 14 03/23/2015 1050   ALT 12  03/23/2015 1050   ALKPHOS 59 03/23/2015 1050   BILITOT 0.4 03/23/2015 1050   GFRNONAA >60 02/25/2015 0426   GFRAA >60 02/25/2015 0426   Lab Results  Component Value Date   CHOL 155 03/23/2015   HDL 67 03/23/2015   LDLCALC 79 03/23/2015   TRIG 46 03/23/2015   CHOLHDL 2.3 03/23/2015   No results found for: HGBA1C No results found for: VITAMINB12 Lab Results  Component Value Date   TSH 0.829 03/23/2015    02/23/15 MRI brain [I reviewed images myself and agree with interpretation. -VRP]  - No acute intracranial abnormality. 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 - Chronic sinusitis.  02/22/15 EEG - "this recording is essentially unremarkable other than for excessive spinning likely due to benzodiazepines. There is no epileptiform  activity observed."     ASSESSMENT AND PLAN  39 y.o. year old female here with new onset generalized convulsive seizure 02/21/15, followed by status epilepticus on 02/22/15. Now stabilized on medication.  Dx: idiopathic primary generalized epilepsy   PLAN:  Patient Instructions  - Continue levetiracetam 1000mg  twice a day.  - Try goodrx.com for discount on medications.  - According to [place] law, you can not drive unless you are seizure free for at least 6 months and under physician's care.   - Please maintain seizure precautions. Do not participate in activities where a loss of awareness could harm you or someone else. No swimming alone, no tub bathing, no hot tubs, no driving, no operating motorized vehicles (cars, ATVs, motocycles, etc), lawnmowers or power tools. No standing at heights, such as rooftops, ladders or stairs. Avoid hot objects such as stoves, heaters, open fires. Wear a helmet when riding a bicycle, scooter, skateboard, etc. and avoid areas of traffic. Set your water heater to 120 degrees or less.    Meds ordered this encounter  Medications  . levETIRAcetam (KEPPRA) 1000 MG tablet    Sig: Take 1 tablet (1,000 mg  total) by mouth 2 (two) times daily.    Dispense:  60 tablet    Refill:  12   Return in about 3 months (around 06/26/2015).    Penni Bombard, MD 7/56/4332, 95:18 PM Certified in Neurology, Neurophysiology and Neuroimaging  [facility] [address], [place], [place] [address] [phone]

## 2015-07-16 ENCOUNTER — Ambulatory Visit (INDEPENDENT_AMBULATORY_CARE_PROVIDER_SITE_OTHER): Payer: PRIVATE HEALTH INSURANCE | Admitting: Diagnostic Neuroimaging

## 2015-07-16 ENCOUNTER — Encounter: Payer: Self-pay | Admitting: Diagnostic Neuroimaging

## 2015-07-16 VITALS — BP 105/69 | HR 66 | Ht 62.0 in | Wt 168.4 lb

## 2015-07-16 DIAGNOSIS — G40909 Epilepsy, unspecified, not intractable, without status epilepticus: Secondary | ICD-10-CM | POA: Diagnosis not present

## 2015-07-16 MED ORDER — LEVETIRACETAM 1000 MG PO TABS: 1000.0000 mg | ORAL_TABLET | Freq: Two times a day (BID) | ORAL | Status: DC

## 2015-07-16 NOTE — Progress Notes (Signed)
[facility]  PATIENT: Megan Harvey DOB: 07/25/1976  REFERRING CLINICIAN: J Memon HISTORY FROM: patient and husband  REASON FOR VISIT: follow up   HISTORICAL  CHIEF COMPLAINT:  Chief Complaint  Patient presents with  . Follow-up    HISTORY OF PRESENT ILLNESS:   UPDATE 07/16/15: Since last visit, doing well. No sz. Tolerating meds, but sometimes has GI issues with the LEV if she takes without food.  PRIOR HPI (03/26/15): 39 year old right-handed female here for evaluation of seizure. 02/21/15 patient was at church, felt hot and sweaty and slumped to the ground. Patient's husband tried to stand her up and then she had generalized convulsions. Her eyes were rolling back, arms out in front of her then generalized body convulsions. She may have had minor tongue biting. No incontinence. Patient was taken to the hospital and admitted for seizure workup. The next day patient had a second seizure resulting in status epilepticus. This required intubation and ICU admission/transfer. Patient was treated with antiseizure medication. Within next day she was able to be extubated. MRI of the brain and EEG were unremarkable. Patient was discharged on levetiracetam 1000 mg twice a day. Since that time patient is doing well. No problems with medication side effects. She is not driving as per recommended driving restrictions. No prior similar symptoms. No history of trauma, meningitis encephalitis. Patient has family history of seizure in her brother who is a few years under than her, and was had seizures since age 55 years old until present time.   REVIEW OF SYSTEMS: Full 14 system review of systems performed and notable only for cramps diarrhea incr appetite weight gain activity change dizziness.  ALLERGIES: Allergies  Allergen Reactions  . Bee Venom Swelling  . Penicillins Hives  . Penicillins Other (See Comments)    Unknown reaction    HOME MEDICATIONS: Outpatient Prescriptions  Prior to Visit  Medication Sig Dispense Refill  . levETIRAcetam (KEPPRA) 1000 MG tablet Take 1 tablet (1,000 mg total) by mouth 2 (two) times daily. 60 tablet 12   No facility-administered medications prior to visit.    PAST MEDICAL HISTORY: Past Medical History  Diagnosis Date  . Seizures ([place])   . Allergy   . Asthma     childhood  . Cancer [facility])     PAST SURGICAL HISTORY: Past Surgical History  Procedure Laterality Date  . Bilateral carpal tunnel release      FAMILY HISTORY: Family History  Problem Relation Age of Onset  . Heart disease Mother   . Hypertension Mother   . Diabetes Mother   . Arthritis Mother   . Hyperlipidemia Mother   . Heart disease Father   . Hypertension Father   . Diabetes Father   . Diabetes Maternal Grandmother   . Diabetes Paternal Grandmother   . Cancer Paternal Grandmother   . Alcohol abuse Sister   . Alcohol abuse Brother   . Alcohol abuse Daughter   . Alcohol abuse Brother     SOCIAL HISTORY:  Social History   Social History  . Marital Status: Married    Spouse Name: Elberta Fortis  . Number of Children: 2  . Years of Education: 12th   Occupational History  . other     [employer]   Social History Main Topics  . Smoking status: Never Smoker   . Smokeless tobacco: Never Used  . Alcohol Use: No  . Drug Use: No  . Sexual Activity: Yes    Birth Control/ Protection: None  Other Topics Concern  . Not on file   Social History Narrative   ** Merged History Encounter **   Patient lives at home with spouse.   Caffeine Use: none         PHYSICAL EXAM  GENERAL EXAM/CONSTITUTIONAL: Vitals:  Filed Vitals:   07/16/15 1009  BP: 105/69  Pulse: 66  Height: 5\' 2"  (1.575 m)  Weight: 168 lb 6.4 oz (76.386 kg)   Body mass index is 30.79 kg/(m^2). No exam data present  Patient is in no distress; well developed, nourished and groomed; neck is supple  CARDIOVASCULAR:  Examination of carotid arteries is normal; no carotid  bruits  Regular rate and rhythm, no murmurs  Examination of peripheral vascular system by observation and palpation is normal  EYES:  Ophthalmoscopic exam of optic discs and posterior segments is normal; no papilledema or hemorrhages  MUSCULOSKELETAL:  Gait, strength, tone, movements noted in Neurologic exam below  NEUROLOGIC: MENTAL STATUS:  No flowsheet data found.  awake, alert, oriented to person, place and time  recent and remote memory intact  normal attention and concentration  language fluent, comprehension intact, naming intact,   fund of knowledge appropriate  CRANIAL NERVE:   2nd - no papilledema on fundoscopic exam  2nd, 3rd, 4th, 6th - pupils equal and reactive to light, visual fields full to confrontation, extraocular muscles intact, no nystagmus  5th - facial sensation symmetric  7th - facial strength symmetric  8th - hearing intact  9th - palate elevates symmetrically, uvula midline  11th - shoulder shrug symmetric  12th - tongue protrusion midline  MOTOR:   normal bulk and tone, full strength in the BUE, BLE  SENSORY:   normal and symmetric to light touch, temperature, vibration    COORDINATION:   finger-nose-finger, fine finger movements normal  REFLEXES:   deep tendon reflexes present and symmetric  GAIT/STATION:   narrow based gait; able to walk tandem; romberg is negative    DIAGNOSTIC DATA (LABS, IMAGING, TESTING) - I reviewed patient records, labs, notes, testing and imaging myself where available.  Lab Results  Component Value Date   WBC 5.9 03/23/2015   HGB 12.5 03/23/2015   HCT 38.0 03/23/2015   MCV 91.8 03/23/2015   PLT 276 03/23/2015      Component Value Date/Time   NA 141 03/23/2015 1050   K 4.3 03/23/2015 1050   CL 104 03/23/2015 1050   CO2 25 03/23/2015 1050   GLUCOSE 83 03/23/2015 1050   BUN 9 03/23/2015 1050   CREATININE 0.73 03/23/2015 1050   CREATININE 0.71 02/25/2015 0426   CALCIUM 9.5  03/23/2015 1050   PROT 6.9 03/23/2015 1050   ALBUMIN 4.0 03/23/2015 1050   AST 14 03/23/2015 1050   ALT 12 03/23/2015 1050   ALKPHOS 59 03/23/2015 1050   BILITOT 0.4 03/23/2015 1050   GFRNONAA >60 02/25/2015 0426   GFRAA >60 02/25/2015 0426   Lab Results  Component Value Date   CHOL 155 03/23/2015   HDL 67 03/23/2015   LDLCALC 79 03/23/2015   TRIG 46 03/23/2015   CHOLHDL 2.3 03/23/2015   No results found for: HGBA1C No results found for: VITAMINB12 Lab Results  Component Value Date   TSH 0.829 03/23/2015    02/23/15 MRI brain [I reviewed images myself and agree with interpretation. -VRP]  - No acute intracranial abnormality. 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 - Chronic sinusitis.  02/22/15 EEG - "this recording is essentially unremarkable other than for excessive spinning  likely due to benzodiazepines. There is no epileptiform activity observed."     ASSESSMENT AND PLAN  39 y.o. year old female here with new onset generalized convulsive seizure 02/21/15, followed by status epilepticus on 02/22/15. Now stabilized on medication.  Dx: idiopathic primary generalized epilepsy   PLAN: - continue current medication: LEV 1000mg  BID - no driving until sz free x 6 months  Meds ordered this encounter  Medications  . levETIRAcetam (KEPPRA) 1000 MG tablet    Sig: Take 1 tablet (1,000 mg total) by mouth 2 (two) times daily.    Dispense:  180 tablet    Refill:  4   Return in about 6 months (around 01/14/2016).    Penni Bombard, MD [id number], [id number] AM Certified in Neurology, Neurophysiology and Neuroimaging  [facility] [address] [place], [place] [address] [phone]

## 2015-07-16 NOTE — Patient Instructions (Signed)

## 2015-08-09 ENCOUNTER — Encounter (HOSPITAL_COMMUNITY): Payer: Self-pay

## 2015-08-09 ENCOUNTER — Emergency Department (HOSPITAL_COMMUNITY)
Admission: EM | Admit: 2015-08-09 | Discharge: 2015-08-09 | Disposition: A | Discharge status: Home / Self Care | Payer: PRIVATE HEALTH INSURANCE | Attending: Emergency Medicine | Admitting: Emergency Medicine

## 2015-08-09 ENCOUNTER — Emergency Department (HOSPITAL_COMMUNITY): Payer: PRIVATE HEALTH INSURANCE

## 2015-08-09 DIAGNOSIS — Z859 Personal history of malignant neoplasm, unspecified: Secondary | ICD-10-CM | POA: Diagnosis not present

## 2015-08-09 DIAGNOSIS — Z79899 Other long term (current) drug therapy: Secondary | ICD-10-CM | POA: Diagnosis not present

## 2015-08-09 DIAGNOSIS — J45909 Unspecified asthma, uncomplicated: Secondary | ICD-10-CM | POA: Diagnosis not present

## 2015-08-09 DIAGNOSIS — Z880 Allergy status to penicillin: Secondary | ICD-10-CM | POA: Insufficient documentation

## 2015-08-09 DIAGNOSIS — R42 Dizziness and giddiness: Secondary | ICD-10-CM | POA: Insufficient documentation

## 2015-08-09 DIAGNOSIS — R079 Chest pain, unspecified: Secondary | ICD-10-CM

## 2015-08-09 LAB — BASIC METABOLIC PANEL
ANION GAP: 7 (ref 5–15)
BUN: 14 mg/dL (ref 6–20)
CALCIUM: 9.3 mg/dL (ref 8.9–10.3)
CO2: 23 mmol/L (ref 22–32)
Chloride: 111 mmol/L (ref 101–111)
Creatinine, Ser: 0.81 mg/dL (ref 0.44–1.00)
GLUCOSE: 117 mg/dL — AB (ref 65–99)
Potassium: 4 mmol/L (ref 3.5–5.1)
SODIUM: 141 mmol/L (ref 135–145)

## 2015-08-09 LAB — I-STAT TROPONIN, ED: TROPONIN I, POC: 0.01 ng/mL (ref 0.00–0.08)

## 2015-08-09 LAB — CBC
HCT: 36.1 % (ref 36.0–46.0)
HEMOGLOBIN: 12.3 g/dL (ref 12.0–15.0)
MCH: 31.1 pg (ref 26.0–34.0)
MCHC: 34.1 g/dL (ref 30.0–36.0)
MCV: 91.2 fL (ref 78.0–100.0)
Platelets: 267 10*3/uL (ref 150–400)
RBC: 3.96 MIL/uL (ref 3.87–5.11)
RDW: 12.3 % (ref 11.5–15.5)
WBC: 9.7 10*3/uL (ref 4.0–10.5)

## 2015-08-09 MED ORDER — ASPIRIN 81 MG PO CHEW
324.0000 mg | CHEWABLE_TABLET | Freq: Once | ORAL | Status: AC
  Administered 2015-08-09: 324 mg via ORAL
  Filled 2015-08-09: qty 4

## 2015-08-09 MED ORDER — SODIUM CHLORIDE 0.9 % IV SOLN
1000.0000 mL | INTRAVENOUS | Status: DC
  Administered 2015-08-09: 1000 mL via INTRAVENOUS

## 2015-08-09 MED ORDER — AMMONIA AROMATIC IN INHA
RESPIRATORY_TRACT | Status: AC
  Filled 2015-08-09: qty 10
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
## 2015-08-09 NOTE — ED Provider Notes (Signed)
CSN: [id number]     Arrival date & time 08/09/15  0603 History   First MD Initiated Contact with Patient 08/09/15 [phone]     Chief Complaint  Patient presents with  . Chest Pain    HPI Pt did not sleep that well last night because of being stressed.  This am she started having chest pain, maybe 1 hr ago.  The pain started on the right side of the chest and moved to the left.  The pain is sharp.  No shortness of breath.  No nausea or vomiting.  The pain has not resolved but it is getting better.  No change in the pain with movement, breathing or any other activity.  No history of heart disease, lung disease or blood clots. Past Medical History  Diagnosis Date  . Seizures ([place])   . Allergy   . Asthma     childhood  . Cancer [facility])    Past Surgical History  Procedure Laterality Date  . Bilateral carpal tunnel release     Family History  Problem Relation Age of Onset  . Heart disease Mother   . Hypertension Mother   . Diabetes Mother   . Arthritis Mother   . Hyperlipidemia Mother   . Heart disease Father   . Hypertension Father   . Diabetes Father   . Diabetes Maternal Grandmother   . Diabetes Paternal Grandmother   . Cancer Paternal Grandmother   . Alcohol abuse Sister   . Alcohol abuse Brother   . Alcohol abuse Daughter   . Alcohol abuse Brother    Social History  Substance Use Topics  . Smoking status: Never Smoker   . Smokeless tobacco: Never Used  . Alcohol Use: No   OB History    Gravida Para Term Preterm AB TAB SAB Ectopic Multiple Living   0 0 0 0 0 0 0 0       Review of Systems  Neurological: Positive for dizziness. Negative for tremors, seizures, weakness and headaches.  All other systems reviewed and are negative.     Allergies  Bee venom; Penicillins; and Penicillins  Home Medications   Prior to Admission medications   Medication Sig Start Date End Date Taking? Authorizing Provider  levETIRAcetam (KEPPRA) 1000 MG tablet Take 1 tablet (1,000 mg  total) by mouth 2 (two) times daily. 07/16/15  Yes Penni Bombard, MD   BP 106/67 mmHg  Pulse 82  Temp(Src) 98.2 F (36.8 C) (Axillary)  Resp 14  SpO2 100%  LMP 07/27/2015 Physical Exam  Constitutional: She appears well-developed and well-nourished. No distress.  HENT:  Head: Normocephalic and atraumatic.  Right Ear: External ear normal.  Left Ear: External ear normal.  Eyes: Conjunctivae are normal. Right eye exhibits no discharge. Left eye exhibits no discharge. No scleral icterus.  Neck: Neck supple. No tracheal deviation present.  Cardiovascular: Normal rate, regular rhythm and intact distal pulses.   Pulmonary/Chest: Effort normal and breath sounds normal. No stridor. No respiratory distress. She has no wheezes. She has no rales. She exhibits no tenderness.  Abdominal: Soft. Bowel sounds are normal. She exhibits no distension. There is no tenderness. There is no rebound and no guarding.  Musculoskeletal: She exhibits no edema or tenderness.  Neurological: She is alert. She has normal strength. No cranial nerve deficit (no facial droop, extraocular movements intact, no slurred speech) or sensory deficit. She exhibits normal muscle tone. She displays no seizure activity. Coordination normal.  Skin: Skin is warm and dry.  No rash noted.  Psychiatric: She has a normal mood and affect.  Nursing note and vitals reviewed.   ED Course  Procedures (including critical care time) Labs Review Labs Reviewed  BASIC METABOLIC PANEL - Abnormal; Notable for the following:    Glucose, Bld 117 (*)    All other components within normal limits  CBC  I-STAT TROPOININ, ED    Imaging Review Dg Chest 2 View  08/09/2015  CLINICAL DATA:  Chest pain starting today EXAM: CHEST  2 VIEW COMPARISON:  03/15/2014 FINDINGS: Cardiomediastinal silhouette is stable. No acute infiltrate or pleural effusion. No pulmonary edema. Minimal degenerative changes mid thoracic spine. IMPRESSION: No active  cardiopulmonary disease. Electronically Signed   By: Lahoma Crocker M.D.   On: 08/09/2015 07:58   I have personally reviewed and evaluated these images and lab results as part of my medical decision-making.   EKG Interpretation   Date/Time:  Monday August 09 2015 06:36:44 EST Ventricular Rate:  81 PR Interval:  216 QRS Duration: 93 QT Interval:  364 QTC Calculation: 422 R Axis:   64 Text Interpretation:  Sinus rhythm Prolonged PR interval , increased since  last tracing Confirmed by   MD-J,  ([id number]) on 08/09/2015 7:02:46  AM      MDM   Final diagnoses:  Chest pain, unspecified chest pain type   I suspect the pt's chest pain is related to the stress and anxiety that she experienced.  She did not sleep at all last night.  She has been tearful at times.    Doubt ACS.  Low risk for heart disease, pain is atypical and labs, EKG are reasuring.  Doubt pulmonary etiology or PE.  Low risk for pE.   PERC negative.  At this time there does not appear to be any evidence of an acute emergency medical condition and the patient appears stable for discharge with appropriate outpatient follow up.    Dorie Rank, MD 08/09/15 [phone]
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
## 2015-08-09 NOTE — ED Notes (Signed)
I preformed an EKG on the Pt without any problems and full cooperation. I then hooked the Pt to blood pressure cuff and pulse ox. I verified Pts name by asking her if her name was Megan Harvey and she said in agreement. Family member at bedside.

## 2015-08-09 NOTE — ED Notes (Signed)
Noted pt. BP to show low recording, this RN walked in pt. Room to round and pt. Was sitting on side of bed without blood pressure cuff off. Pt. Tearful, states she is ok. Pt. Family member at bedside.

## 2015-08-09 NOTE — Discharge Instructions (Signed)

## 2015-08-09 NOTE — ED Notes (Signed)
Pt transported to xray 

## 2015-08-09 NOTE — ED Notes (Signed)
Pt from home by [facility]. Per pt family pt has chest pain. Pt will not talk to explain what is hurting or going on. Pt pulls away to painful stimuli.

## 2015-11-10 ENCOUNTER — Encounter: Payer: Self-pay | Admitting: Family Medicine

## 2016-01-07 ENCOUNTER — Encounter (HOSPITAL_COMMUNITY): Payer: Self-pay

## 2016-01-07 ENCOUNTER — Emergency Department (HOSPITAL_COMMUNITY)
Admission: EM | Admit: 2016-01-07 | Discharge: 2016-01-07 | Disposition: A | Discharge status: Home / Self Care | Payer: PRIVATE HEALTH INSURANCE | Attending: Emergency Medicine | Admitting: Emergency Medicine

## 2016-01-07 DIAGNOSIS — Z880 Allergy status to penicillin: Secondary | ICD-10-CM | POA: Insufficient documentation

## 2016-01-07 DIAGNOSIS — Z859 Personal history of malignant neoplasm, unspecified: Secondary | ICD-10-CM | POA: Insufficient documentation

## 2016-01-07 DIAGNOSIS — L509 Urticaria, unspecified: Secondary | ICD-10-CM | POA: Insufficient documentation

## 2016-01-07 DIAGNOSIS — Z79899 Other long term (current) drug therapy: Secondary | ICD-10-CM | POA: Insufficient documentation

## 2016-01-07 DIAGNOSIS — J45909 Unspecified asthma, uncomplicated: Secondary | ICD-10-CM | POA: Insufficient documentation

## 2016-01-07 MED ORDER — PREDNISONE 20 MG PO TABS
60.0000 mg | ORAL_TABLET | Freq: Once | ORAL | Status: AC
  Administered 2016-01-07: 60 mg via ORAL
  Filled 2016-01-07: qty 3

## 2016-01-07 MED ORDER — DIPHENHYDRAMINE HCL 25 MG PO CAPS: 50.0000 mg | ORAL_CAPSULE | Freq: Once | ORAL | Status: DC

## 2016-01-07 MED ORDER — FAMOTIDINE 20 MG PO TABS
20.0000 mg | ORAL_TABLET | Freq: Once | ORAL | Status: AC
  Administered 2016-01-07: 20 mg via ORAL
  Filled 2016-01-07: qty 1

## 2016-01-07 MED ORDER — PREDNISONE 20 MG PO TABS: 60.0000 mg | ORAL_TABLET | Freq: Every day | ORAL | Status: AC

## 2016-01-07 MED ORDER — DIPHENHYDRAMINE HCL 25 MG PO TABS: 25.0000 mg | ORAL_TABLET | Freq: Four times a day (QID) | ORAL | Status: DC

## 2016-01-07 MED ORDER — FAMOTIDINE 20 MG PO TABS: 20.0000 mg | ORAL_TABLET | Freq: Two times a day (BID) | ORAL | Status: DC

## 2016-01-07 NOTE — Discharge Instructions (Signed)
Hives Hives are itchy, red, swollen areas of the skin. They can vary in size and location on your body. Hives can come and go for hours or several days (acute hives) or for several weeks (chronic hives). Hives do not spread from person to person (noncontagious). They may get worse with scratching, exercise, and emotional stress. CAUSES   Allergic reaction to food, additives, or drugs.  Infections, including the common cold.  Illness, such as vasculitis, lupus, or thyroid disease.  Exposure to sunlight, heat, or cold.  Exercise.  Stress.  Contact with chemicals. SYMPTOMS   Red or white swollen patches on the skin. The patches may change size, shape, and location quickly and repeatedly.  Itching.  Swelling of the hands, feet, and face. This may occur if hives develop deeper in the skin. DIAGNOSIS  Your caregiver can usually tell what is wrong by performing a physical exam. Skin or blood tests may also be done to determine the cause of your hives. In some cases, the cause cannot be determined. TREATMENT  Mild cases usually get better with medicines such as antihistamines. Severe cases may require an emergency epinephrine injection. If the cause of your hives is known, treatment includes avoiding that trigger.  HOME CARE INSTRUCTIONS   Avoid causes that trigger your hives.  Take antihistamines as directed by your caregiver to reduce the severity of your hives. Non-sedating or low-sedating antihistamines are usually recommended. Do not drive while taking an antihistamine.  Take any other medicines prescribed for itching as directed by your caregiver.  Wear loose-fitting clothing.  Keep all follow-up appointments as directed by your caregiver. SEEK MEDICAL CARE IF:   You have persistent or severe itching that is not relieved with medicine.  You have painful or swollen joints. SEEK IMMEDIATE MEDICAL CARE IF:   You have a fever.  Your tongue or lips are swollen.  You have  trouble breathing or swallowing.  You feel tightness in the throat or chest.  You have abdominal pain. These problems may be the first sign of a life-threatening allergic reaction. Call your local emergency services (911 in [place] MAKE SURE YOU:   Understand these instructions.  Will watch your condition.  Will get help right away if you are not doing well or get worse.   This information is not intended to replace advice given to you by your health care provider. Make sure you discuss any questions you have with your health care provider.   Document Released: 07/31/2005 Document Revised: 08/05/2013 Document Reviewed: 10/24/2011 Elsevier Interactive Patient Education 2016 [employer] Inc[employer]  

## 2016-01-07 NOTE — ED Provider Notes (Signed)
History  By signing my name below, I, Bea Graff, attest that this documentation has been prepared under the direction and in the presence of Charlann Lange, PA-C. Electronically Signed: Bea Graff, ED Scribe. 01/07/2016. 3:35 PM.  Chief Complaint  Patient presents with  . Rash   The history is provided by the patient and medical records. No language interpreter was used.    HPI Comments:  Megan Harvey is a 40 y.o. female who presents to the Emergency Department complaining of a rash to bilateral arms and feet that began about 3.5 hours ago. She states she took a new dietary supplement earlier today and believes that is what caused the rash. She reports some blurred vision and SOB earlier that has since resolved. She has not taken anything to treat the symptoms. She denies modifying factors. She denies current SOB, difficulty swallowing, facial swelling, nausea, vomiting, fever or chills. She is allergic to PCN.  Past Medical History  Diagnosis Date  . Seizures ([place])   . Allergy   . Asthma     childhood  . Cancer [facility])    Past Surgical History  Procedure Laterality Date  . Bilateral carpal tunnel release     Family History  Problem Relation Age of Onset  . Heart disease Mother   . Hypertension Mother   . Diabetes Mother   . Arthritis Mother   . Hyperlipidemia Mother   . Heart disease Father   . Hypertension Father   . Diabetes Father   . Diabetes Maternal Grandmother   . Diabetes Paternal Grandmother   . Cancer Paternal Grandmother   . Alcohol abuse Sister   . Alcohol abuse Brother   . Alcohol abuse Daughter   . Alcohol abuse Brother    Social History  Substance Use Topics  . Smoking status: Never Smoker   . Smokeless tobacco: Never Used  . Alcohol Use: No   OB History    Gravida Para Term Preterm AB TAB SAB Ectopic Multiple Living   0 0 0 0 0 0 0 0       Review of Systems  Constitutional: Negative for fever and chills.  HENT: Negative for facial  swelling and trouble swallowing.   Respiratory: Negative for shortness of breath.   Gastrointestinal: Negative for nausea and vomiting.  Skin: Positive for rash.    Allergies  Bee venom; Penicillins; and Penicillins  Home Medications   Prior to Admission medications   Medication Sig Start Date End Date Taking? Authorizing Provider  diphenhydrAMINE (BENADRYL) 25 MG tablet Take 1 tablet (25 mg total) by mouth every 6 (six) hours. 01/07/16   Charlann Lange, PA-C  famotidine (PEPCID) 20 MG tablet Take 1 tablet (20 mg total) by mouth 2 (two) times daily. 01/07/16   Charlann Lange, PA-C  levETIRAcetam (KEPPRA) 1000 MG tablet Take 1 tablet (1,000 mg total) by mouth 2 (two) times daily. 07/16/15   Penni Bombard, MD  predniSONE (DELTASONE) 20 MG tablet Take 3 tablets (60 mg total) by mouth daily. 01/08/16 01/09/16  Charlann Lange, PA-C   Triage Vitals: BP 115/68 mmHg  Pulse 65  Temp(Src) 98.6 F (37 C) (Oral)  Resp 16  Ht 5\' 2"  (1.575 m)  Wt 158 lb (71.668 kg)  BMI 28.89 kg/m2  SpO2 100% Physical Exam  Constitutional: She is oriented to person, place, and time. She appears well-developed and well-nourished.  HENT:  Head: Normocephalic and atraumatic.  No oropharyngeal swelling.  Eyes: EOM are normal.  Neck: Normal range of  motion.  Cardiovascular: Normal rate, regular rhythm and normal heart sounds.   Pulmonary/Chest: Effort normal and breath sounds normal. No respiratory distress. She has no wheezes. She has no rales.  Musculoskeletal: Normal range of motion.  Neurological: She is alert and oriented to person, place, and time.  Skin: Skin is warm and dry. Rash noted.  Raised, red, confluent rash to bilateral ankles, dorsal feet, bilateral forearms and neck consistent with hives.  Psychiatric: She has a normal mood and affect. Her behavior is normal.  Nursing note and vitals reviewed.   ED Course  Procedures (including critical care time) DIAGNOSTIC STUDIES: Oxygen Saturation is  100% on RA, normal by my interpretation.   COORDINATION OF CARE: 3:21 PM- Will prescribe Prednisone. Will give Pepcid and Prednisone prior to discharge. Advised pt to take OTC Benadryl at home for symptom control. Return precautions discussed. Pt verbalizes understanding and agrees to plan.  Medications  diphenhydrAMINE (BENADRYL) capsule 50 mg (not administered)  famotidine (PEPCID) tablet 20 mg (not administered)  predniSONE (DELTASONE) tablet 60 mg (not administered)     MDM   Final diagnoses:  Hives    1. Hives  Patient with allergic reaction limited to hives. No SOB, difficulty swallowing. Started on Benadryl, Pepcid, Prednisone. Return precautions discused.  I personally performed the services described in this documentation, which was scribed in my presence. The recorded information has been reviewed and is accurate.    Charlann Lange, PA-C 01/07/16 [place], MD 01/13/16 [id number]

## 2016-01-07 NOTE — ED Notes (Signed)
Pt from home, pt complains of rash on both arms and both feet since 1200. Pt only allergic to penicillin  That she knows of. Pt took a supplement at work this morning and she believes she is allergic to it. Airway intact. NAD.

## 2016-01-07 NOTE — ED Notes (Signed)
Pt verbalized understanding of d/c instructions and has no further questions. Pt stable and NAD, airway intact.

## 2016-01-14 ENCOUNTER — Ambulatory Visit: Payer: PRIVATE HEALTH INSURANCE | Admitting: Diagnostic Neuroimaging

## 2016-01-17 ENCOUNTER — Encounter: Payer: Self-pay | Admitting: Diagnostic Neuroimaging

## 2016-02-04 ENCOUNTER — Encounter: Payer: Self-pay | Admitting: Family Medicine

## 2016-06-09 ENCOUNTER — Encounter: Payer: Self-pay | Admitting: Family Medicine

## 2016-06-09 ENCOUNTER — Ambulatory Visit (INDEPENDENT_AMBULATORY_CARE_PROVIDER_SITE_OTHER): Payer: PRIVATE HEALTH INSURANCE | Admitting: Family Medicine

## 2016-06-09 VITALS — BP 110/64 | HR 72 | Temp 98.1°F | Resp 14 | Ht 62.0 in | Wt 164.0 lb

## 2016-06-09 DIAGNOSIS — N938 Other specified abnormal uterine and vaginal bleeding: Secondary | ICD-10-CM

## 2016-06-09 DIAGNOSIS — N979 Female infertility, unspecified: Secondary | ICD-10-CM

## 2016-06-09 DIAGNOSIS — Z23 Encounter for immunization: Secondary | ICD-10-CM

## 2016-06-09 LAB — CBC WITH DIFFERENTIAL/PLATELET
BASOS ABS: 72 {cells}/uL (ref 0–200)
Basophils Relative: 1 %
EOS ABS: 576 {cells}/uL — AB (ref 15–500)
EOS PCT: 8 %
HEMATOCRIT: 38 % (ref 35.0–45.0)
HEMOGLOBIN: 12.4 g/dL (ref 12.0–15.0)
LYMPHS ABS: 1368 {cells}/uL (ref 850–3900)
Lymphocytes Relative: 19 %
MCH: 30.5 pg (ref 27.0–33.0)
MCHC: 32.6 g/dL (ref 32.0–36.0)
MCV: 93.4 fL (ref 80.0–100.0)
MONO ABS: 720 {cells}/uL (ref 200–950)
MPV: 9.8 fL (ref 7.5–12.5)
Monocytes Relative: 10 %
NEUTROS ABS: 4464 {cells}/uL (ref 1500–7800)
NEUTROS PCT: 62 %
Platelets: 387 10*3/uL (ref 140–400)
RBC: 4.07 MIL/uL (ref 3.80–5.10)
RDW: 12.8 % (ref 11.0–15.0)
WBC: 7.2 10*3/uL (ref 3.8–10.8)

## 2016-06-09 LAB — TSH: TSH: 0.48 m[IU]/L

## 2016-06-09 NOTE — Patient Instructions (Addendum)
Referral to GYN  No new medications F/U pending results

## 2016-06-09 NOTE — Progress Notes (Signed)
   Subjective:    Patient ID: Megan Harvey, female    DOB: Jul 06, 1976, 40 y.o.   MRN: [id number]  Patient presents for Irregular Menses (states that she has had increased menstruation x30 days- reports increased cramping and clotting) Is here with a regular menstrual cycle. 2 months ago she had a cycle 3 days it went off and came back one for 3 days. She has been trying to conceive with her husband is concerned about her age conceiving. She had 2 normal cycles since thenbut then since the end of September she has had some type of bleeding for the past month straight. Typically she will have regular cycle for a week. She will have light spotting maybe a day with no bleeding and then it returns for another week. She just ended a cycle yesterday. She does get cramping and sharp pain that comes on the left side  She did take a pregnancy test about a month ago which was negative. She denies any vaginal discharge denies any change in her bowels. She has a normal Pap smear in 2016  She does have a 39 year old female in a 38-year-old son  Review Of Systems:  GEN- denies fatigue, fever, weight loss,weakness, recent illness HEENT- denies eye drainage, change in vision, nasal discharge, CVS- denies chest pain, palpitations RESP- denies SOB, cough, wheeze ABD- denies N/V, change in stools, abd pain GU- denies dysuria, hematuria, dribbling, incontinence MSK- denies joint pain, muscle aches, injury Neuro- denies headache, dizziness, syncope, seizure activity       Objective:    BP 110/64 (BP Location: Left Arm, Patient Position: Sitting, Cuff Size: Normal)   Pulse 72   Temp 98.1 F (36.7 C) (Oral)   Resp 14   Ht 5\' 2"  (1.575 m)   Wt 164 lb (74.4 kg)   BMI 30.00 kg/m  GEN- NAD, alert and oriented x3 Neck- Supple, no thyromegaly CVS- RRR, no murmur RESP-CTAB ABD-NABS,soft,NT,ND Pulses- Radial  2+        Assessment & Plan:      Problem List Items Addressed This Visit    None    Visit  Diagnoses    DUB (dysfunctional uterine bleeding)    -  Primary   Concern for her abnormal bleeding in the setting of trying to conceive. The med check some hormone levels as well as a CBC she's no longer bleeding at this time. Chest there try to conceive and she is advanced maternal age and she will be 36 in February we get her set up with GYN to have ultrasound and any further workup being necessary    Relevant Orders   CBC with Differential/Platelet   TSH   FSH/LH   hCG, quantitative, pregnancy      Note: This dictation was prepared with Dragon dictation along with smaller phrase technology. Any transcriptional errors that result from this process are unintentional.

## 2016-06-09 NOTE — Addendum Note (Signed)
Addended by: Sheral Flow on: 06/09/2016 05:33 PM   Modules accepted: Orders

## 2016-06-10 LAB — FSH/LH
FSH: 12.3 m[IU]/mL
LH: 13.7 m[IU]/mL

## 2016-06-10 LAB — HCG, QUANTITATIVE, PREGNANCY

## 2016-08-01 ENCOUNTER — Encounter: Payer: Self-pay | Admitting: Family Medicine

## 2016-08-01 ENCOUNTER — Telehealth: Payer: Self-pay | Admitting: *Deleted

## 2016-08-01 ENCOUNTER — Ambulatory Visit (INDEPENDENT_AMBULATORY_CARE_PROVIDER_SITE_OTHER): Payer: 59 | Admitting: Family Medicine

## 2016-08-01 VITALS — BP 118/72 | HR 72 | Temp 98.4°F | Resp 14 | Ht 62.0 in | Wt 163.0 lb

## 2016-08-01 DIAGNOSIS — Z3200 Encounter for pregnancy test, result unknown: Secondary | ICD-10-CM | POA: Diagnosis not present

## 2016-08-01 LAB — PREGNANCY, URINE: Preg Test, Ur: NEGATIVE

## 2016-08-01 LAB — HCG, QUANTITATIVE, PREGNANCY: hCG, Beta Chain, Quant, S: <2 m[IU]/mL

## 2016-08-01 MED ORDER — PRENATAL VITAMIN 27-0.8 MG PO TABS: 1.0000 | ORAL_TABLET | Freq: Every day | ORAL | 0 refills | Status: DC

## 2016-08-01 NOTE — Progress Notes (Signed)
   Subjective:    Patient ID: Megan Harvey, female    DOB: 1975-09-07, 40 y.o.   MRN: [id number]  Patient presents for Hot Flashes (x3 weeks- intermittent hot flashes and tightness above breast - denies pain to breasts) Patient will hot flashes some tenderness above her breast. She denies any actual chest pain shortness of breath or palpitations. She is trying to actively get pregnant. She did see GYN after our last visit in October. She was told that she most likely had a miscarriage. She became very tearful stating they've been trying to get pregnant and is having a lot of difficulty and she is worried about her age. Her period is currently 5 days late she took a home pregnancy test this was negative. She declines any stress otherwise.    Review Of Systems:  GEN- denies fatigue, fever, weight loss,weakness, recent illness HEENT- denies eye drainage, change in vision, nasal discharge, CVS- denies chest pain, palpitations RESP- denies SOB, cough, wheeze ABD- denies N/V, change in stools, abd pain GU- denies dysuria, hematuria, dribbling, incontinence MSK- denies joint pain, muscle aches, injury Neuro- denies headache, dizziness, syncope, seizure activity       Objective:    BP 118/72 (BP Location: Left Arm, Patient Position: Sitting, Cuff Size: Normal)   Pulse 72   Temp 98.4 F (36.9 C) (Oral)   Resp 14   Ht 5\' 2"  (1.575 m)   Wt 163 lb (73.9 kg)   LMP 06/26/2016 Comment: irregular  SpO2 100%   BMI 29.81 kg/m  GEN- NAD, alert and oriented x3 HEENT- PERRL, EOMI, non injected sclera, pink conjunctiva, MMM, oropharynx clear Neck- Supple, no thyromegaly CVS- RRR, no murmur  Chest wall- mild TTP across chest wall, no rash  RESP-CTAB ABD-NABS,soft,NT,ND Psych- tearful, not depressed or anxious appearing  Pulses- Radial - 2+        Assessment & Plan:      Problem List Items Addressed This Visit    None    Visit Diagnoses    Encounter for pregnancy test, result unknown     -  Primary   She has some abnormal cycles, hormonal changes, HCG quant in blood Neg today. Her breast tenderness was just mild chest wall pain, no sign of infection, recommend she see GYN to discuss if hormones, clomid, MTF needed    Relevant Orders   Pregnancy, urine (Completed)   hCG, quantitative, pregnancy (Completed)      Note: This dictation was prepared with Dragon dictation along with smaller phrase technology. Any transcriptional errors that result from this process are unintentional.

## 2016-08-01 NOTE — Telephone Encounter (Signed)
She can see GYN, I am not referring to IVF There are medications such as metformin, Clomid, progresterones depending on what the GYN says and these are covered by insurance. I do not prescribe these for fertility.

## 2016-08-01 NOTE — Patient Instructions (Signed)
We will call with lab results F/U pending results  

## 2016-08-01 NOTE — Telephone Encounter (Signed)
Received call from patient.   Patient reports that her insurance does not cover fertility treatments.   What medication is covered?

## 2016-08-02 NOTE — Telephone Encounter (Signed)
Call placed to patient. LMTRC.  

## 2016-08-02 NOTE — Telephone Encounter (Signed)
Patient returned call and made aware.

## 2016-12-14 ENCOUNTER — Encounter: Payer: Self-pay | Admitting: Family Medicine

## 2017-03-23 ENCOUNTER — Ambulatory Visit (HOSPITAL_COMMUNITY)
Admission: EM | Admit: 2017-03-23 | Discharge: 2017-03-23 | Disposition: A | Discharge status: Home / Self Care | Payer: 59 | Attending: Internal Medicine | Admitting: Internal Medicine

## 2017-03-23 ENCOUNTER — Encounter (HOSPITAL_COMMUNITY): Payer: Self-pay | Admitting: Emergency Medicine

## 2017-03-23 DIAGNOSIS — R1010 Upper abdominal pain, unspecified: Secondary | ICD-10-CM | POA: Diagnosis not present

## 2017-03-23 DIAGNOSIS — Z880 Allergy status to penicillin: Secondary | ICD-10-CM | POA: Insufficient documentation

## 2017-03-23 DIAGNOSIS — Z3202 Encounter for pregnancy test, result negative: Secondary | ICD-10-CM | POA: Diagnosis not present

## 2017-03-23 DIAGNOSIS — N898 Other specified noninflammatory disorders of vagina: Secondary | ICD-10-CM

## 2017-03-23 DIAGNOSIS — Z91030 Bee allergy status: Secondary | ICD-10-CM | POA: Diagnosis not present

## 2017-03-23 DIAGNOSIS — N3090 Cystitis, unspecified without hematuria: Secondary | ICD-10-CM | POA: Diagnosis not present

## 2017-03-23 DIAGNOSIS — R300 Dysuria: Secondary | ICD-10-CM | POA: Insufficient documentation

## 2017-03-23 DIAGNOSIS — R102 Pelvic and perineal pain: Secondary | ICD-10-CM | POA: Diagnosis not present

## 2017-03-23 LAB — POCT URINALYSIS DIP (DEVICE)
Bilirubin Urine: NEGATIVE
GLUCOSE, UA: NEGATIVE mg/dL
Hgb urine dipstick: NEGATIVE
KETONES UR: NEGATIVE mg/dL
Nitrite: NEGATIVE
PH: 7 (ref 5.0–8.0)
PROTEIN: NEGATIVE mg/dL
SPECIFIC GRAVITY, URINE: 1.02 (ref 1.005–1.030)
Urobilinogen, UA: 1 mg/dL (ref 0.0–1.0)

## 2017-03-23 LAB — POCT PREGNANCY, URINE: Preg Test, Ur: NEGATIVE

## 2017-03-23 MED ORDER — CIPROFLOXACIN HCL 500 MG PO TABS: 500.0000 mg | ORAL_TABLET | Freq: Two times a day (BID) | ORAL | 0 refills | Status: DC

## 2017-03-23 NOTE — ED Triage Notes (Signed)
The patient presented to the [facility] with a complaint of dysuria and vaginal pain x 1 day.

## 2017-03-23 NOTE — ED Provider Notes (Signed)
    MRN: [id number] DOB: July 27, 1976  Subjective:   Megan Harvey is a 41 y.o. female presenting for chief complaint of Dysuria and Vaginal Pain  Reports 1 day history of dysuria, urinary frequency, cloudy malodorous urine, mild occasional low back pain. Also has yellow vaginal discharge. Patient is sexually active with her husband, practices monogamy. Her LMP was 03/16/2017, was irregular but admits that she has never had regular cycles. Has not tried medications for her symptoms. Denies fever, n/v, abdominal pain, pelvic pain, genital rashes, flank pain. Denies smoking cigarettes.  Megan Harvey is not currently taking any medications. Also is allergic to bee venom; penicillins; and penicillins.  Megan Harvey  has a past medical history of Allergy; Asthma; and Seizures ([place]). Also  has a past surgical history that includes Bilateral carpal tunnel release.  Objective:   Vitals: BP 125/77 (BP Location: Right Arm)   Pulse 70   Temp 98.5 F (36.9 C) (Oral)   Resp 18   SpO2 98%   Physical Exam  Constitutional: She is oriented to person, place, and time. She appears well-developed and well-nourished.  Cardiovascular: Normal rate, regular rhythm and intact distal pulses.  Exam reveals no gallop and no friction rub.   No murmur heard. Pulmonary/Chest: No respiratory distress. She has no wheezes. She has no rales.  Abdominal: Soft. Bowel sounds are normal. She exhibits no distension and no mass. There is tenderness (mild over upper abdomen). There is no guarding.  No CVA tenderness.  Neurological: She is alert and oriented to person, place, and time.   Results for orders placed or performed during the hospital encounter of 03/23/17 (from the past 24 hour(s))  POCT urinalysis dip (device)     Status: Abnormal   Collection Time: 03/23/17  6:45 PM  Result Value Ref Range   Glucose, UA NEGATIVE NEGATIVE mg/dL   Bilirubin Urine NEGATIVE NEGATIVE   Ketones, ur NEGATIVE NEGATIVE mg/dL   Specific Gravity, Urine  1.020 1.005 - 1.030   Hgb urine dipstick NEGATIVE NEGATIVE   pH 7.0 5.0 - 8.0   Protein, ur NEGATIVE NEGATIVE mg/dL   Urobilinogen, UA 1.0 0.0 - 1.0 mg/dL   Nitrite NEGATIVE NEGATIVE   Leukocytes, UA TRACE (A) NEGATIVE  Pregnancy, urine POC     Status: None   Collection Time: 03/23/17  6:54 PM  Result Value Ref Range   Preg Test, Ur NEGATIVE NEGATIVE   Assessment and Plan :   1. Cystitis   2. Dysuria   3. Vaginal discharge   4. Upper abdominal pain     Labs pending for STI check at patient's request. Will cover patient for cystitis with ciprofloxacin. Advised aggressive hydration. Return-to-clinic precautions discussed, patient verbalized understanding.    Jaynee Eagles, [place] 03/23/17 [id number]

## 2017-03-26 ENCOUNTER — Telehealth (HOSPITAL_COMMUNITY): Payer: Self-pay | Admitting: Internal Medicine

## 2017-03-26 LAB — CERVICOVAGINAL ANCILLARY ONLY
BACTERIAL VAGINITIS: POSITIVE — AB
CHLAMYDIA, DNA PROBE: NEGATIVE
Candida vaginitis: NEGATIVE
NEISSERIA GONORRHEA: NEGATIVE
TRICH (WINDOWPATH): POSITIVE — AB

## 2017-03-26 MED ORDER — METRONIDAZOLE 500 MG PO TABS: 500.0000 mg | ORAL_TABLET | Freq: Two times a day (BID) | ORAL | 0 refills | Status: DC

## 2017-03-26 NOTE — Telephone Encounter (Signed)
Clinical staff, please let patient know that test for trichomonas was positive.  Rx metronidazole was sent to the pharmacy of record, [facility] on [address].  Please refrain from sexual intercourse for 7 days to give the medicine time to work.  Sexual partners need to be notified and tested/treated.  Condoms may reduce risk of reinfection.   Test for gardnerella (bacterial vaginosis) was also positive.  Metronidazole rx will cover gardnerella.   Recheck or followup with PCP for further evaluation if symptoms are not improving.  LM

## 2017-05-07 ENCOUNTER — Emergency Department (INDEPENDENT_AMBULATORY_CARE_PROVIDER_SITE_OTHER)
Admission: EM | Admit: 2017-05-07 | Discharge: 2017-05-07 | Disposition: A | Discharge status: Home / Self Care | Payer: 59 | Source: Home / Self Care | Attending: Family Medicine | Admitting: Family Medicine

## 2017-05-07 ENCOUNTER — Encounter: Payer: Self-pay | Admitting: *Deleted

## 2017-05-07 DIAGNOSIS — R21 Rash and other nonspecific skin eruption: Secondary | ICD-10-CM | POA: Diagnosis not present

## 2017-05-07 MED ORDER — HYDROCODONE-ACETAMINOPHEN 5-325 MG PO TABS: ORAL_TABLET | ORAL | 0 refills | Status: DC

## 2017-05-07 MED ORDER — DOXYCYCLINE HYCLATE 100 MG PO CAPS: 100.0000 mg | ORAL_CAPSULE | Freq: Two times a day (BID) | ORAL | 0 refills | Status: DC

## 2017-05-07 MED ORDER — VALACYCLOVIR HCL 1 G PO TABS: 1000.0000 mg | ORAL_TABLET | Freq: Three times a day (TID) | ORAL | 0 refills | Status: DC

## 2017-05-07 NOTE — Discharge Instructions (Signed)
May take antihistamine (Benadryl, Zyrtec, etc) as needed for itching.

## 2017-05-07 NOTE — ED Provider Notes (Signed)
[name]    CSN: [id number] Arrival date & time: 05/07/17  0827     History   Chief Complaint Chief Complaint  Patient presents with  . Insect Bite    HPI Megan Harvey is a 41 y.o. female.   Two days ago patient felt a burning sensation on the back of her left neck and shoulder, and noticed a rash there.  She also developed a burning sensation and mild swelling/redness of her left middle finger.  She believes that she may have had an insect bite, although she does not remember any bites.  She feels well otherwise.    Rash  Location: left neck and shoulder. Quality: burning, dryness, itchiness, painful, redness and swelling   Quality: not blistering, not bruising, not draining, not peeling, not scaling and not weeping   Pain details:    Quality:  Burning and itching   Severity:  Mild   Onset quality:  Sudden   Duration:  2 days   Timing:  Constant   Progression:  Worsening Severity:  Mild Onset quality:  Sudden Duration:  2 days Timing:  Constant Progression:  Worsening Chronicity:  New Context: insect bite/sting   Context: not animal contact, not chemical exposure, not exposure to similar rash, not food, not hot tub use, not medications, not new detergent/soap, not nuts, not plant contact, not pollen, not pregnancy, not sick contacts and not sun exposure   Relieved by:  None tried Worsened by:  Nothing Ineffective treatments:  None tried Associated symptoms: no abdominal pain, no diarrhea, no fatigue, no fever, no headaches, no hoarse voice, no induration, no joint pain, no myalgias, no nausea, no shortness of breath, no sore throat, no throat swelling, no tongue swelling, no URI and not wheezing     Past Medical History:  Diagnosis Date  . Allergy   . Asthma    childhood  . Seizures [facility])     Patient Active Problem List   Diagnosis Date Noted  . Allergy   . Asthma   . Cancer ([place])   . Seizure ([place]) 02/21/2015    Past Surgical History:    Procedure Laterality Date  . BILATERAL CARPAL TUNNEL RELEASE      OB History    Gravida Para Term Preterm AB Living   0 0 0 0 0     SAB TAB Ectopic Multiple Live Births   0 0 0           Home Medications    Prior to Admission medications   Medication Sig Start Date End Date Taking? Authorizing Provider  doxycycline (VIBRAMYCIN) 100 MG capsule Take 1 capsule (100 mg total) by mouth 2 (two) times daily. Take with food. 05/07/17   Kandra Nicolas, MD  HYDROcodone-acetaminophen (NORCO/VICODIN) 5-325 MG tablet Take one by mouth at bedtime as needed for pain 05/07/17   Kandra Nicolas, MD  valACYclovir (VALTREX) 1000 MG tablet Take 1 tablet (1,000 mg total) by mouth 3 (three) times daily. 05/07/17   Kandra Nicolas, MD    Family History Family History  Problem Relation Age of Onset  . Heart disease Mother   . Hypertension Mother   . Diabetes Mother   . Arthritis Mother   . Hyperlipidemia Mother   . Heart disease Father   . Hypertension Father   . Diabetes Father   . Diabetes Maternal Grandmother   . Diabetes Paternal Grandmother   . Cancer Paternal Grandmother   . Alcohol abuse Sister   .  Alcohol abuse Brother   . Alcohol abuse Brother   . Alcohol abuse Daughter     Social History Social History  Substance Use Topics  . Smoking status: Never Smoker  . Smokeless tobacco: Never Used  . Alcohol use No     Allergies   Bee venom; Penicillins; and Penicillins   Review of Systems Review of Systems  Constitutional: Negative for fatigue and fever.  HENT: Negative for hoarse voice and sore throat.   Respiratory: Negative for shortness of breath and wheezing.   Gastrointestinal: Negative for abdominal pain, diarrhea and nausea.  Musculoskeletal: Negative for arthralgias and myalgias.  Skin: Positive for rash.  Neurological: Negative for headaches.  All other systems reviewed and are negative.    Physical Exam Triage Vital Signs ED Triage Vitals [05/07/17 0846]   Enc Vitals Group     BP 112/81     Pulse Rate 73     Resp 16     Temp 98.4 F (36.9 C)     Temp Source Oral     SpO2 100 %     Weight 169 lb (76.7 kg)     Height 5\' 2"  (1.575 m)     Head Circumference      Peak Flow      Pain Score 0     Pain Loc      Pain Edu?      Excl. in [place]?    No data found.   Updated Vital Signs BP 112/81 (BP Location: Left Arm)   Pulse 73   Temp 98.4 F (36.9 C) (Oral)   Resp 16   Ht 5\' 2"  (1.575 m)   Wt 169 lb (76.7 kg)   LMP 03/28/2017   SpO2 100%   BMI 30.91 kg/m   Visual Acuity Right Eye Distance:   Left Eye Distance:   Bilateral Distance:    Right Eye Near:   Left Eye Near:    Bilateral Near:     Physical Exam  Constitutional: She appears well-developed and well-nourished. No distress.  HENT:  Head: Normocephalic.  Right Ear: External ear normal.  Left Ear: External ear normal.  Nose: Nose normal.  Mouth/Throat: Oropharynx is clear and moist.  Eyes: Pupils are equal, round, and reactive to light. Conjunctivae are normal.  Neck: Normal range of motion.    Erythematous slightly raised herpetiform lesions left neck and trapezius area as noted on diagram.   Cardiovascular: Normal rate.   Pulmonary/Chest: Effort normal.  Musculoskeletal:       Hands: Left middle finger is slightly swollen and erythematous with minimal tenderness to palpation.  Neurological: She is alert.  Skin: Skin is warm and dry.  Nursing note and vitals reviewed.    UC Treatments / Results  Labs (all labs ordered are listed, but only abnormal results are displayed) Labs Reviewed - No data to display  EKG  EKG Interpretation None       Radiology No results found.  Procedures Procedures (including critical care time)  Medications Ordered in UC Medications - No data to display   Initial Impression / Assessment and Plan / UC Course  I have reviewed the triage vital signs and the nursing notes.  Pertinent labs & imaging results that  were available during my care of the patient were reviewed by me and considered in my medical decision making (see chart for details).    Suspect herpes zoster in C6-7 distribution.  Begin Valtrex. Could also be infected insect bites; will  begin empiric doxycycline. Rx for Lortab at bedtime prn (#5, no refill). Controlled Substance Prescriptions I have consulted the [place] Controlled Substances Registry for this patient, and feel the risk/benefit ratio today is favorable for proceeding with this prescription for a controlled substance.   May take antihistamine (Benadryl, Zyrtec, etc) as needed for itching. Followup with Family Doctor if not improved in one week.     Final Clinical Impressions(s) / UC Diagnoses   Final diagnoses:  Rash and nonspecific skin eruption    New Prescriptions New Prescriptions   DOXYCYCLINE (VIBRAMYCIN) 100 MG CAPSULE    Take 1 capsule (100 mg total) by mouth 2 (two) times daily. Take with food.   HYDROCODONE-ACETAMINOPHEN (NORCO/VICODIN) 5-325 MG TABLET    Take one by mouth at bedtime as needed for pain   VALACYCLOVIR (VALTREX) 1000 MG TABLET    Take 1 tablet (1,000 mg total) by mouth 3 (three) times daily.         Kandra Nicolas, MD 05/07/17 [phone]

## 2017-05-07 NOTE — ED Triage Notes (Signed)
Pt c/o possible insect bite to her LT 3rd finger and the back of her neck x 2 nights ago. She reports numbness on the tip of her tongue, with some itching and burning at the sites.

## 2017-05-14 ENCOUNTER — Emergency Department (INDEPENDENT_AMBULATORY_CARE_PROVIDER_SITE_OTHER)
Admission: EM | Admit: 2017-05-14 | Discharge: 2017-05-14 | Disposition: A | Discharge status: Home / Self Care | Payer: 59 | Source: Home / Self Care | Attending: Family Medicine | Admitting: Family Medicine

## 2017-05-14 ENCOUNTER — Encounter: Payer: Self-pay | Admitting: *Deleted

## 2017-05-14 DIAGNOSIS — M25531 Pain in right wrist: Secondary | ICD-10-CM

## 2017-05-14 DIAGNOSIS — R21 Rash and other nonspecific skin eruption: Secondary | ICD-10-CM

## 2017-05-14 DIAGNOSIS — M7721 Periarthritis, right wrist: Secondary | ICD-10-CM | POA: Diagnosis not present

## 2017-05-14 MED ORDER — CLINDAMYCIN HCL 300 MG PO CAPS: 300.0000 mg | ORAL_CAPSULE | Freq: Three times a day (TID) | ORAL | 0 refills | Status: DC

## 2017-05-14 MED ORDER — TRIAMCINOLONE ACETONIDE 0.1 % EX CREA: 1.0000 "application " | TOPICAL_CREAM | Freq: Two times a day (BID) | CUTANEOUS | 0 refills | Status: DC

## 2017-05-14 MED ORDER — TRAMADOL HCL 50 MG PO TABS: 50.0000 mg | ORAL_TABLET | Freq: Two times a day (BID) | ORAL | 0 refills | Status: DC | PRN

## 2017-05-14 MED ORDER — IBUPROFEN 600 MG PO TABS: 600.0000 mg | ORAL_TABLET | Freq: Four times a day (QID) | ORAL | 0 refills | Status: AC | PRN

## 2017-05-14 NOTE — Discharge Instructions (Signed)
°  Please take antibiotics as prescribed and be sure to complete entire course even if you start to feel better to ensure infection does not come back.  Tramadol is strong pain medication. While taking, do not drink alcohol, drive, or perform any other activities that requires focus while taking these medications.   You may take 500mg  acetaminophen every 4-6 hours or in combination with ibuprofen 600mg  every 6-8 hours as needed for pain, inflammation, and fever.

## 2017-05-14 NOTE — ED Triage Notes (Signed)
Patient c/o right wrist and hand swelling and redness x this AM. Started with itching then burning. Did not feel a bite.

## 2017-05-14 NOTE — ED Provider Notes (Signed)
M[facility]    CSN: [id number] Arrival date & time: 05/14/17  0931     History   Chief Complaint Chief Complaint  Patient presents with  . Wrist Swelling    HPI Zeva Leber is a 41 y.o. female.   HPI Rogena Deupree is a 41 y.o. female presenting to UC with c/o sudden onset Right wrist pain, swelling, redness, warmth and itching that woke her from her sleep this morning.  She noticed two small dots that appear to be insect bites but did not see any bugs under her pillow or in her bed.  Pain is aching and sore, 9/10, worse with palpation and movement of wrist. No recent injury.  She has not taken anything new for these symptoms but notes she was seen last week for shingles on the Left side of her neck and shoulder.  She completed the prescription of Doxycycline but has 1 day left of her Valtrex.  She also completed her 5 tabs of Vicodin that were prescribed for pain. That rash has nearly resolved.  She has not been under any increased stress recently. Denies fever, chills, n/v/d. No hx of DM.  No hx of gout.   Past Medical History:  Diagnosis Date  . Allergy   . Asthma    childhood  . Seizures [facility])     Patient Active Problem List   Diagnosis Date Noted  . Allergy   . Asthma   . Cancer ([place])   . Seizure ([place]) 02/21/2015    Past Surgical History:  Procedure Laterality Date  . BILATERAL CARPAL TUNNEL RELEASE      OB History    Gravida Para Term Preterm AB Living   0 0 0 0 0     SAB TAB Ectopic Multiple Live Births   0 0 0           Home Medications    Prior to Admission medications   Medication Sig Start Date End Date Taking? Authorizing Provider  clindamycin (CLEOCIN) 300 MG capsule Take 1 capsule (300 mg total) by mouth 3 (three) times daily. X 5 days 05/14/17   Noe Gens, PA-C  HYDROcodone-acetaminophen (NORCO/VICODIN) 5-325 MG tablet Take one by mouth at bedtime as needed for pain 05/07/17   Kandra Nicolas, MD  ibuprofen (ADVIL,MOTRIN) 600 MG  tablet Take 1 tablet (600 mg total) by mouth every 6 (six) hours as needed. 05/14/17   Noe Gens, PA-C  traMADol (ULTRAM) 50 MG tablet Take 1 tablet (50 mg total) by mouth every 12 (twelve) hours as needed for moderate pain or severe pain. 05/14/17   Noe Gens, PA-C  triamcinolone cream (KENALOG) 0.1 % Apply 1 application topically 2 (two) times daily. 05/14/17   Noe Gens, PA-C  valACYclovir (VALTREX) 1000 MG tablet Take 1 tablet (1,000 mg total) by mouth 3 (three) times daily. 05/07/17   Kandra Nicolas, MD    Family History Family History  Problem Relation Age of Onset  . Heart disease Mother   . Hypertension Mother   . Diabetes Mother   . Arthritis Mother   . Hyperlipidemia Mother   . Heart disease Father   . Hypertension Father   . Diabetes Father   . Diabetes Maternal Grandmother   . Diabetes Paternal Grandmother   . Cancer Paternal Grandmother   . Alcohol abuse Sister   . Alcohol abuse Brother   . Alcohol abuse Brother   . Alcohol abuse Daughter  Social History Social History  Substance Use Topics  . Smoking status: Never Smoker  . Smokeless tobacco: Never Used  . Alcohol use No     Allergies   Bee venom; Penicillins; and Penicillins   Review of Systems Review of Systems  Constitutional: Negative for chills and fever.  Gastrointestinal: Negative for nausea and vomiting.  Musculoskeletal: Positive for arthralgias, joint swelling and myalgias.       Right wrist and hand pain  Skin: Positive for color change. Negative for wound.     Physical Exam Triage Vital Signs ED Triage Vitals  Enc Vitals Group     BP 05/14/17 0955 102/69     Pulse Rate 05/14/17 0955 68     Resp 05/14/17 0955 14     Temp 05/14/17 0955 98.4 F (36.9 C)     Temp Source 05/14/17 0955 Oral     SpO2 05/14/17 0955 100 %     Weight 05/14/17 0956 170 lb (77.1 kg)     Height --      Head Circumference --      Peak Flow --      Pain Score 05/14/17 0956 9     Pain Loc --        Pain Edu? --      Excl. in [place]? --    No data found.   Updated Vital Signs BP 102/69 (BP Location: Left Arm)   Pulse 68   Temp 98.4 F (36.9 C) (Oral)   Resp 14   Wt 170 lb (77.1 kg)   LMP 03/28/2017   SpO2 100%   BMI 31.09 kg/m   Visual Acuity Right Eye Distance:   Left Eye Distance:   Bilateral Distance:    Right Eye Near:   Left Eye Near:    Bilateral Near:     Physical Exam  Constitutional: She is oriented to person, place, and time. She appears well-developed and well-nourished.  HENT:  Head: Normocephalic and atraumatic.  Eyes: EOM are normal.  Neck: Normal range of motion.  Cardiovascular: Normal rate.   Pulmonary/Chest: Effort normal.  Musculoskeletal: She exhibits edema and tenderness.       Arms: Right wrist and hand: mild to moderate edema. Tenderness to dorsal aspect. Limited flexion and extension of wrist. 4/5 grip strength.  Right elbow: non-tender. Full ROM  Neurological: She is alert and oriented to person, place, and time.  Skin: Skin is warm and dry. Capillary refill takes less than 2 seconds. Rash noted. There is erythema.  Right wrist and hand: dorsal aspect- erythema, warmth, tenderness. Four faint papular lesions. No vesicles or pustules.   Psychiatric: She has a normal mood and affect. Her behavior is normal.  Nursing note and vitals reviewed.    UC Treatments / Results  Labs (all labs ordered are listed, but only abnormal results are displayed) Labs Reviewed - No data to display  EKG  EKG Interpretation None       Radiology No results found.  Procedures Procedures (including critical care time)  Medications Ordered in UC Medications - No data to display   Initial Impression / Assessment and Plan / UC Course  I have reviewed the triage vital signs and the nursing notes.  Pertinent labs & imaging results that were available during my care of the patient were reviewed by me and considered in my medical decision making  (see chart for details).     Sudden onset Right wrist and hand pain with swelling and warm.  Exam appears c/w local reaction vs early skin infection from insect bites.  Pt has hives and other unknown reaction to PCN. Pt was recently on doxycycline, will start her on Clindamycin F/u with PCP in 1 week if not improving.   Final Clinical Impressions(s) / UC Diagnoses   Final diagnoses:  Inflammation around wrist, right  Right wrist pain  Rash and nonspecific skin eruption    New Prescriptions Discharge Medication List as of 05/14/2017 10:12 AM    START taking these medications   Details  clindamycin (CLEOCIN) 300 MG capsule Take 1 capsule (300 mg total) by mouth 3 (three) times daily. X 5 days, Starting Mon 05/14/2017, Normal    ibuprofen (ADVIL,MOTRIN) 600 MG tablet Take 1 tablet (600 mg total) by mouth every 6 (six) hours as needed., Starting Mon 05/14/2017, Normal    traMADol (ULTRAM) 50 MG tablet Take 1 tablet (50 mg total) by mouth every 12 (twelve) hours as needed for moderate pain or severe pain., Starting Mon 05/14/2017, Print    triamcinolone cream (KENALOG) 0.1 % Apply 1 application topically 2 (two) times daily., Starting Mon 05/14/2017, Normal         Controlled Substance Prescriptions [place] Controlled Substance Registry consulted? Attempted to check the database, however, pt was not found in the data base despite being prescribed Norco on 05/07/17 from [facility].  Only prescribed 6 tabs of Tramadol for initial pain.     Noe Gens, PA-C 05/14/17 1046

## 2017-11-29 ENCOUNTER — Encounter (HOSPITAL_COMMUNITY): Payer: Self-pay | Admitting: *Deleted

## 2017-11-29 ENCOUNTER — Other Ambulatory Visit: Payer: Self-pay

## 2017-11-29 ENCOUNTER — Ambulatory Visit (HOSPITAL_COMMUNITY)
Admission: EM | Admit: 2017-11-29 | Discharge: 2017-11-29 | Disposition: A | Discharge status: Home / Self Care | Payer: Self-pay | Attending: Family Medicine | Admitting: Family Medicine

## 2017-11-29 ENCOUNTER — Emergency Department (HOSPITAL_COMMUNITY)
Admission: EM | Admit: 2017-11-29 | Discharge: 2017-11-29 | Disposition: A | Discharge status: Home / Self Care | Payer: 59 | Attending: Emergency Medicine | Admitting: Emergency Medicine

## 2017-11-29 ENCOUNTER — Encounter (HOSPITAL_COMMUNITY): Payer: Self-pay | Admitting: Emergency Medicine

## 2017-11-29 DIAGNOSIS — R109 Unspecified abdominal pain: Secondary | ICD-10-CM | POA: Insufficient documentation

## 2017-11-29 DIAGNOSIS — R1033 Periumbilical pain: Secondary | ICD-10-CM

## 2017-11-29 DIAGNOSIS — Z5321 Procedure and treatment not carried out due to patient leaving prior to being seen by health care provider: Secondary | ICD-10-CM | POA: Insufficient documentation

## 2017-11-29 LAB — I-STAT BETA HCG BLOOD, ED (MC, WL, AP ONLY)

## 2017-11-29 LAB — CBC
HEMATOCRIT: 33.7 % — AB (ref 36.0–46.0)
HEMOGLOBIN: 11.5 g/dL — AB (ref 12.0–15.0)
MCH: 31 pg (ref 26.0–34.0)
MCHC: 34.1 g/dL (ref 30.0–36.0)
MCV: 90.8 fL (ref 78.0–100.0)
Platelets: 393 10*3/uL (ref 150–400)
RBC: 3.71 MIL/uL — AB (ref 3.87–5.11)
RDW: 12.6 % (ref 11.5–15.5)
WBC: 8.7 10*3/uL (ref 4.0–10.5)

## 2017-11-29 LAB — COMPREHENSIVE METABOLIC PANEL
ALK PHOS: 65 U/L (ref 38–126)
ALT: 27 U/L (ref 14–54)
ANION GAP: 8 (ref 5–15)
AST: 24 U/L (ref 15–41)
Albumin: 3.7 g/dL (ref 3.5–5.0)
BUN: 8 mg/dL (ref 6–20)
CALCIUM: 8.8 mg/dL — AB (ref 8.9–10.3)
CO2: 24 mmol/L (ref 22–32)
Chloride: 104 mmol/L (ref 101–111)
Creatinine, Ser: 0.78 mg/dL (ref 0.44–1.00)
GFR calc non Af Amer: >60 mL/min (ref >60)
Glucose, Bld: 108 mg/dL — ABNORMAL HIGH (ref 65–99)
POTASSIUM: 3.5 mmol/L (ref 3.5–5.1)
SODIUM: 136 mmol/L (ref 135–145)
TOTAL PROTEIN: 6.7 g/dL (ref 6.5–8.1)
Total Bilirubin: 0.4 mg/dL (ref 0.3–1.2)

## 2017-11-29 LAB — LIPASE, BLOOD: Lipase: 26 U/L (ref 11–51)

## 2017-11-29 MED ORDER — CEPHALEXIN 500 MG PO CAPS: 500.0000 mg | ORAL_CAPSULE | Freq: Four times a day (QID) | ORAL | 0 refills | Status: DC

## 2017-11-29 NOTE — ED Provider Notes (Signed)
Megan Harvey    CSN: [id number] Arrival date & time: 11/29/17  1048     History   Chief Complaint Chief Complaint  Patient presents with  . Umbilical Hernia    HPI Megan Harvey is a 42 y.o. female.   Patient has pain in the umbilicus.  She has not noted any swelling.  There is no nausea or vomiting.  HPI  Past Medical History:  Diagnosis Date  . Allergy   . Asthma    childhood  . Seizures [facility])     Patient Active Problem List   Diagnosis Date Noted  . Allergy   . Asthma   . Cancer ([place])   . Seizure ([place]) 02/21/2015    Past Surgical History:  Procedure Laterality Date  . BILATERAL CARPAL TUNNEL RELEASE      OB History    Gravida  0   Para  0   Term  0   Preterm  0   AB  0   Living        SAB  0   TAB  0   Ectopic  0   Multiple      Live Births               Home Medications    Prior to Admission medications   Medication Sig Start Date End Date Taking? Authorizing Provider  clindamycin (CLEOCIN) 300 MG capsule Take 1 capsule (300 mg total) by mouth 3 (three) times daily. X 5 days 05/14/17   Noe Gens, PA-C  HYDROcodone-acetaminophen (NORCO/VICODIN) 5-325 MG tablet Take one by mouth at bedtime as needed for pain 05/07/17   Kandra Nicolas, MD  ibuprofen (ADVIL,MOTRIN) 600 MG tablet Take 1 tablet (600 mg total) by mouth every 6 (six) hours as needed. 05/14/17   Noe Gens, PA-C  traMADol (ULTRAM) 50 MG tablet Take 1 tablet (50 mg total) by mouth every 12 (twelve) hours as needed for moderate pain or severe pain. 05/14/17   Noe Gens, PA-C  triamcinolone cream (KENALOG) 0.1 % Apply 1 application topically 2 (two) times daily. 05/14/17   Noe Gens, PA-C  valACYclovir (VALTREX) 1000 MG tablet Take 1 tablet (1,000 mg total) by mouth 3 (three) times daily. 05/07/17   Kandra Nicolas, MD    Family History Family History  Problem Relation Age of Onset  . Heart disease Mother   . Hypertension Mother   . Diabetes  Mother   . Arthritis Mother   . Hyperlipidemia Mother   . Heart disease Father   . Hypertension Father   . Diabetes Father   . Diabetes Maternal Grandmother   . Diabetes Paternal Grandmother   . Cancer Paternal Grandmother   . Alcohol abuse Sister   . Alcohol abuse Brother   . Alcohol abuse Brother   . Alcohol abuse Daughter     Social History Social History   Tobacco Use  . Smoking status: Never Smoker  . Smokeless tobacco: Never Used  Substance Use Topics  . Alcohol use: No  . Drug use: No     Allergies   Bee venom; Penicillins; and Penicillins   Review of Systems Review of Systems  Constitutional: Negative for chills and fever.  HENT: Negative for ear pain and sore throat.   Eyes: Negative for pain and visual disturbance.  Respiratory: Negative for cough and shortness of breath.   Cardiovascular: Negative for chest pain and palpitations.  Gastrointestinal: Positive for abdominal pain. Negative  for vomiting.  Genitourinary: Negative for dysuria and hematuria.  Musculoskeletal: Negative for arthralgias and back pain.  Skin: Negative for color change and rash.  Neurological: Negative for seizures and syncope.  All other systems reviewed and are negative.    Physical Exam Triage Vital Signs ED Triage Vitals  Enc Vitals Group     BP 11/29/17 1108 124/67     Pulse Rate 11/29/17 1108 77     Resp --      Temp 11/29/17 1108 98.6 F (37 C)     Temp Source 11/29/17 1108 Oral     SpO2 11/29/17 1108 100 %     Weight --      Height --      Head Circumference --      Peak Flow --      Pain Score 11/29/17 1106 10     Pain Loc --      Pain Edu? --      Excl. in [place]? --    No data found.  Updated Vital Signs BP 124/67 (BP Location: Left Arm)   Pulse 77   Temp 98.6 F (37 C) (Oral)   LMP 11/22/2017   SpO2 100%   Visual Acuity Right Eye Distance:   Left Eye Distance:   Bilateral Distance:    Right Eye Near:   Left Eye Near:    Bilateral Near:      Physical Exam  Constitutional: She appears well-developed and well-nourished.  Abdominal: Soft. There is tenderness.  Pain is periumbilical.  There is slight erythema around the umbilicus and the umbilicus itself appears dirty. Specifically there is no right lower quadrant pain     UC Treatments / Results  Labs (all labs ordered are listed, but only abnormal results are displayed) Labs Reviewed - No data to display  EKG None Radiology No results found.  Procedures Procedures (including critical care time)  Medications Ordered in UC Medications - No data to display   Initial Impression / Assessment and Plan / UC Course  I have reviewed the triage vital signs and the nursing notes.  Pertinent labs & imaging results that were available during my care of the patient were reviewed by me and considered in my medical decision making (see chart for details).     Umbilical pain, probably secondary to infection.  Will treat with local hygiene measures and cephalexin 500 mg 4 times a day for 5 days  Final Clinical Impressions(s) / UC Diagnoses   Final diagnoses:  Umbilical pain    ED Discharge Orders    None       Controlled Substance Prescriptions [place] Controlled Substance Registry consulted? No   Wardell Honour, MD 11/29/17 [phone]

## 2017-11-29 NOTE — ED Notes (Addendum)
Patient did not answer x2 when called to have vital signs reassessed.

## 2017-11-29 NOTE — ED Triage Notes (Signed)
The pt is c/o abd pain since 2030   No n v or diarrhea  lmp oned week

## 2017-11-29 NOTE — ED Triage Notes (Signed)
States having pain when touch umbilicus onset last PM

## 2017-12-12 ENCOUNTER — Encounter (HOSPITAL_BASED_OUTPATIENT_CLINIC_OR_DEPARTMENT_OTHER): Payer: Self-pay | Admitting: *Deleted

## 2017-12-12 ENCOUNTER — Emergency Department (HOSPITAL_BASED_OUTPATIENT_CLINIC_OR_DEPARTMENT_OTHER)
Admission: EM | Admit: 2017-12-12 | Discharge: 2017-12-12 | Disposition: A | Discharge status: Home / Self Care | Payer: PRIVATE HEALTH INSURANCE | Attending: Emergency Medicine | Admitting: Emergency Medicine

## 2017-12-12 ENCOUNTER — Other Ambulatory Visit: Payer: Self-pay

## 2017-12-12 DIAGNOSIS — Z859 Personal history of malignant neoplasm, unspecified: Secondary | ICD-10-CM | POA: Insufficient documentation

## 2017-12-12 DIAGNOSIS — T7840XA Allergy, unspecified, initial encounter: Secondary | ICD-10-CM | POA: Diagnosis present

## 2017-12-12 DIAGNOSIS — J45909 Unspecified asthma, uncomplicated: Secondary | ICD-10-CM | POA: Diagnosis not present

## 2017-12-12 MED ORDER — EPINEPHRINE 0.3 MG/0.3ML IJ SOAJ: 0.3000 mg | Freq: Once | INTRAMUSCULAR | 0 refills | Status: AC

## 2017-12-12 MED ORDER — SODIUM CHLORIDE 0.9 % IV BOLUS
1000.0000 mL | Freq: Once | INTRAVENOUS | Status: AC
  Administered 2017-12-12: 1000 mL via INTRAVENOUS

## 2017-12-12 NOTE — ED Provider Notes (Signed)
[facility] EMERGENCY DEPARTMENT Provider Note   CSN: [id number] Arrival date & time: 12/12/17  1157     History   Chief Complaint Chief Complaint  Patient presents with  . Allergic Reaction    HPI Megan Harvey is a 42 y.o. female.  Pt presents to the ED today with a possible allergic reaction.  She was at work today when she felt a spider or something bite her right leg (she did not see it).  She felt an immediate burning and said she passed out.  EMS was called who gave pt epi and benadryl.  Pt is feeling much better now.     Past Medical History:  Diagnosis Date  . Allergy   . Asthma    childhood  . Seizures [facility])     Patient Active Problem List   Diagnosis Date Noted  . Allergy   . Asthma   . Cancer ([place])   . Seizure ([place]) 02/21/2015    Past Surgical History:  Procedure Laterality Date  . BILATERAL CARPAL TUNNEL RELEASE       OB History    Gravida  0   Para  0   Term  0   Preterm  0   AB  0   Living        SAB  0   TAB  0   Ectopic  0   Multiple      Live Births               Home Medications    Prior to Admission medications   Medication Sig Start Date End Date Taking? Authorizing Provider  cephALEXin (KEFLEX) 500 MG capsule Take 1 capsule (500 mg total) by mouth 4 (four) times daily. 11/29/17   Wardell Honour, MD  clindamycin (CLEOCIN) 300 MG capsule Take 1 capsule (300 mg total) by mouth 3 (three) times daily. X 5 days 05/14/17   Noe Gens, PA-C  EPINEPHrine 0.3 mg/0.3 mL IJ SOAJ injection Inject 0.3 mLs (0.3 mg total) into the muscle once for 1 dose. May repeat times 1 if symptoms worsen. 12/12/17 12/12/17  Isla Pence, MD  HYDROcodone-acetaminophen (NORCO/VICODIN) 5-325 MG tablet Take one by mouth at bedtime as needed for pain 05/07/17   Kandra Nicolas, MD  ibuprofen (ADVIL,MOTRIN) 600 MG tablet Take 1 tablet (600 mg total) by mouth every 6 (six) hours as needed. 05/14/17   Noe Gens, PA-C  traMADol  (ULTRAM) 50 MG tablet Take 1 tablet (50 mg total) by mouth every 12 (twelve) hours as needed for moderate pain or severe pain. 05/14/17   Noe Gens, PA-C  triamcinolone cream (KENALOG) 0.1 % Apply 1 application topically 2 (two) times daily. 05/14/17   Noe Gens, PA-C  valACYclovir (VALTREX) 1000 MG tablet Take 1 tablet (1,000 mg total) by mouth 3 (three) times daily. 05/07/17   Kandra Nicolas, MD    Family History Family History  Problem Relation Age of Onset  . Heart disease Mother   . Hypertension Mother   . Diabetes Mother   . Arthritis Mother   . Hyperlipidemia Mother   . Heart disease Father   . Hypertension Father   . Diabetes Father   . Diabetes Maternal Grandmother   . Diabetes Paternal Grandmother   . Cancer Paternal Grandmother   . Alcohol abuse Sister   . Alcohol abuse Brother   . Alcohol abuse Brother   . Alcohol abuse Daughter  Social History Social History   Tobacco Use  . Smoking status: Never Smoker  . Smokeless tobacco: Never Used  Substance Use Topics  . Alcohol use: No  . Drug use: No     Allergies   Bee venom; Penicillins; and Penicillins   Review of Systems Review of Systems  Skin: Positive for rash.  All other systems reviewed and are negative.    Physical Exam Updated Vital Signs BP 127/67 (BP Location: Right Arm)   Pulse 84   Temp 98.3 F (36.8 C) (Oral)   Resp 16   Ht 5\' 2"  (1.575 m)   Wt 83.5 kg (184 lb)   LMP 11/22/2017   SpO2 100%   BMI 33.65 kg/m   Physical Exam  Constitutional: She is oriented to person, place, and time. She appears well-developed and well-nourished.  HENT:  Head: Normocephalic and atraumatic.  Right Ear: External ear normal.  Left Ear: External ear normal.  Nose: Nose normal.  Mouth/Throat: Oropharynx is clear and moist.  Eyes: Pupils are equal, round, and reactive to light. Conjunctivae and EOM are normal.  Neck: Normal range of motion. Neck supple.  Cardiovascular: Normal rate,  regular rhythm, normal heart sounds and intact distal pulses.  Pulmonary/Chest: Effort normal and breath sounds normal.  Abdominal: Soft. Bowel sounds are normal.  Musculoskeletal: Normal range of motion.  Neurological: She is alert and oriented to person, place, and time.  Skin:  RLE:  No swelling or hives.  Psychiatric: She has a normal mood and affect. Her behavior is normal. Judgment and thought content normal.  Nursing note and vitals reviewed.    ED Treatments / Results  Labs (all labs ordered are listed, but only abnormal results are displayed) Labs Reviewed - No data to display  EKG None  Radiology No results found.  Procedures Procedures (including critical care time)  Medications Ordered in ED Medications  sodium chloride 0.9 % bolus 1,000 mL (0 mLs Intravenous Stopped 12/12/17 1322)     Initial Impression / Assessment and Plan / ED Course  I have reviewed the triage vital signs and the nursing notes.  Pertinent labs & imaging results that were available during my care of the patient were reviewed by me and considered in my medical decision making (see chart for details).     Pt is feeling much better.  Suspect allergic reaction to whatever insect stung her.  Pt is stable for d/c.  Return if worse.  Final Clinical Impressions(s) / ED Diagnoses   Final diagnoses:  Allergic reaction, initial encounter    ED Discharge Orders        Ordered    EPINEPHrine 0.3 mg/0.3 mL IJ SOAJ injection   Once     12/12/17 1414       Isla Pence, MD 12/12/17 1416

## 2017-12-12 NOTE — ED Triage Notes (Signed)
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 She isn't exactly sure what bit her. She just felt a burn in her RLE. Pt stated that she passed out. She was given Epi and benadryl in route.

## 2017-12-12 NOTE — Discharge Instructions (Signed)
Over the counter benadryl and pepcid as needed for rash or allergic symptoms.

## 2017-12-12 NOTE — ED Notes (Signed)
No bite mark noted to right LLE where the patient claimed that she got bit.  Denies pain or burning sensation to affected site. No swelling to site.

## 2017-12-12 NOTE — ED Notes (Signed)
Pt did Esign, chart is being accessed somewhere else, so it is read only.

## 2017-12-12 NOTE — ED Notes (Signed)
NAD at this time. Pt is stable and going home.  

## 2018-09-20 ENCOUNTER — Ambulatory Visit (INDEPENDENT_AMBULATORY_CARE_PROVIDER_SITE_OTHER): Payer: BLUE CROSS/BLUE SHIELD | Admitting: Family Medicine

## 2018-09-20 ENCOUNTER — Encounter: Payer: Self-pay | Admitting: Family Medicine

## 2018-09-20 VITALS — BP 120/82 | HR 87 | Temp 98.6°F | Resp 16 | Ht 62.0 in | Wt 175.0 lb

## 2018-09-20 DIAGNOSIS — B9689 Other specified bacterial agents as the cause of diseases classified elsewhere: Secondary | ICD-10-CM

## 2018-09-20 DIAGNOSIS — R300 Dysuria: Secondary | ICD-10-CM | POA: Diagnosis not present

## 2018-09-20 DIAGNOSIS — N760 Acute vaginitis: Secondary | ICD-10-CM | POA: Diagnosis not present

## 2018-09-20 LAB — URINALYSIS, ROUTINE W REFLEX MICROSCOPIC
BILIRUBIN URINE: NEGATIVE
GLUCOSE, UA: NEGATIVE
Ketones, ur: NEGATIVE
Nitrite: NEGATIVE
PH: 7 (ref 5.0–8.0)
Protein, ur: NEGATIVE
Specific Gravity, Urine: 1.02 (ref 1.001–1.03)

## 2018-09-20 LAB — WET PREP FOR TRICH, YEAST, CLUE

## 2018-09-20 LAB — MICROSCOPIC MESSAGE

## 2018-09-20 MED ORDER — METRONIDAZOLE 500 MG PO TABS: 500.0000 mg | ORAL_TABLET | Freq: Two times a day (BID) | ORAL | 0 refills | Status: AC

## 2018-09-20 NOTE — Addendum Note (Signed)
Addended by: Delsa Grana on: 09/20/2018 03:28 PM   Modules accepted: Orders

## 2018-09-20 NOTE — Progress Notes (Signed)
Patient ID: Megan Harvey, female    DOB: 11-17-1975, 43 y.o.   MRN: [id number]  PCP: Alycia Rossetti, MD  Chief Complaint  Patient presents with  . Vaginal Discharge    Patient has c/o foul odor with white discharge during intercourse. Onset 1 month ago.    Subjective:   Megan Harvey is a 43 y.o. female, presents to clinic with CC of vaginal odor and discharge x 1 month and dysuria x 1 week - also malodorous urine. Vag discharge - noticing with sex and in panty liner since sx started 1 month ago, persistent, no change, described as "thick cream with wiping," sometimes dark brown in panty liners.  AT first she says odor is only with intercourse with her husband, they both notice it, but later she says she's notice odor other times.   Periods LMP 09/08/2018,.  Last about 3 days, she thought it would get better after her period but has not she is also tried douching and this did not help either.  She is only sexually active with husband, monogamous for many years.  No pain with sex, he has not had any symptoms of discharge and neither of them have any genital rash, lesions, swelling or itching. For 1 week she has had dysuria and malodorous urine.  She briefly had some backaches and some shooting burning pain low in her urethra with urination but she otherwise denies abdominal pain, nausea, vomiting, fever, sweats, chills, change in appetite.   She says there is no chance she could be pregnant but she is not on birth control, husband has not had a vasectomy.  I asked her how she knows that she could not be pregnant she says that she was told that 2 years ago.  She further explains that she was trying to get pregnant 2 years ago but she was having some perimenopausal symptoms and she was told that she could not get pregnant.  She said she is having irregular periods but the farthest that her menses have been spaced out has been 6 weeks.  No history of STDs, recurrent UTIs, pyelonephritis.  No  immunocompromise or diabetes.       HPI    Patient Active Problem List   Diagnosis Date Noted  . Allergy   . Asthma   . Cancer ([place])   . Seizure ([place]) 02/21/2015     Prior to Admission medications   Medication Sig Start Date End Date Taking? Authorizing Provider  ibuprofen (ADVIL,MOTRIN) 600 MG tablet Take 1 tablet (600 mg total) by mouth every 6 (six) hours as needed. 05/14/17  Yes Phelps, Bronwen Betters, PA-C  valACYclovir (VALTREX) 1000 MG tablet Take 1 tablet (1,000 mg total) by mouth 3 (three) times daily. Patient not taking: Reported on 09/20/2018 05/07/17   Kandra Nicolas, MD     Allergies  Allergen Reactions  . Bee Venom Swelling  . Penicillins Hives  . Penicillins Other (See Comments)    Unknown reaction     Family History  Problem Relation Age of Onset  . Heart disease Mother   . Hypertension Mother   . Diabetes Mother   . Arthritis Mother   . Hyperlipidemia Mother   . Heart disease Father   . Hypertension Father   . Diabetes Father   . Diabetes Maternal Grandmother   . Diabetes Paternal Grandmother   . Cancer Paternal Grandmother   . Alcohol abuse Sister   . Alcohol abuse Brother   . Alcohol abuse  Brother   . Alcohol abuse Daughter      Social History   Socioeconomic History  . Marital status: Married    Spouse name: Elberta Fortis  . Number of children: 2  . Years of education: 12th  . Highest education level: Not on file  Occupational History  . Occupation: other    Comment: [employer]  Social Needs  . Financial resource strain: Not on file  . Food insecurity:    Worry: Not on file    Inability: Not on file  . Transportation needs:    Medical: Not on file    Non-medical: Not on file  Tobacco Use  . Smoking status: Never Smoker  . Smokeless tobacco: Never Used  Substance and Sexual Activity  . Alcohol use: No  . Drug use: No  . Sexual activity: Yes    Birth control/protection: None  Lifestyle  . Physical activity:    Days per week: Not on  file    Minutes per session: Not on file  . Stress: Not on file  Relationships  . Social connections:    Talks on phone: Not on file    Gets together: Not on file    Attends religious service: Not on file    Active member of club or organization: Not on file    Attends meetings of clubs or organizations: Not on file    Relationship status: Not on file  . Intimate partner violence:    Fear of current or ex partner: Not on file    Emotionally abused: Not on file    Physically abused: Not on file    Forced sexual activity: Not on file  Other Topics Concern  . Not on file  Social History Narrative   ** Merged History Encounter **   Patient lives at home with spouse.   Caffeine Use: none         Review of Systems  Constitutional: Negative.   HENT: Negative.   Eyes: Negative.   Respiratory: Negative.   Cardiovascular: Negative.   Gastrointestinal: Negative.   Endocrine: Negative.   Genitourinary: Negative.   Musculoskeletal: Negative.   Skin: Negative.   Allergic/Immunologic: Negative.   Neurological: Negative.   Hematological: Negative.   Psychiatric/Behavioral: Negative.   All other systems reviewed and are negative.      Objective:    Vitals:   09/20/18 0841  BP: 120/82  Pulse: 87  Resp: 16  Temp: 98.6 F (37 C)  TempSrc: Oral  SpO2: 99%  Weight: 175 lb (79.4 kg)  Height: 5\' 2"  (1.575 m)      Physical Exam Vitals signs and nursing note reviewed.  Constitutional:      General: She is not in acute distress.    Appearance: Normal appearance. She is well-developed. She is not ill-appearing, toxic-appearing or diaphoretic.  HENT:     Head: Normocephalic and atraumatic.     Right Ear: External ear normal.     Left Ear: External ear normal.     Nose: Nose normal.     Mouth/Throat:     Mouth: Mucous membranes are moist.     Pharynx: Oropharynx is clear. Uvula midline.  Eyes:     General: Lids are normal.        Right eye: No discharge.        Left  eye: No discharge.     Conjunctiva/sclera: Conjunctivae normal.     Pupils: Pupils are equal, round, and reactive to light.  Neck:  Musculoskeletal: Normal range of motion and neck supple.     Trachea: Phonation normal. No tracheal deviation.  Cardiovascular:     Rate and Rhythm: Normal rate and regular rhythm.     Pulses: Normal pulses.          Radial pulses are 2+ on the right side and 2+ on the left side.       Posterior tibial pulses are 2+ on the right side and 2+ on the left side.     Heart sounds: Normal heart sounds. No murmur. No friction rub. No gallop.   Pulmonary:     Effort: Pulmonary effort is normal. No respiratory distress.     Breath sounds: Normal breath sounds. No stridor. No wheezing, rhonchi or rales.  Chest:     Chest wall: No tenderness.  Abdominal:     General: Bowel sounds are normal. There is no distension.     Palpations: Abdomen is soft.     Tenderness: There is no abdominal tenderness. There is no right CVA tenderness, left CVA tenderness, guarding or rebound.  Genitourinary:    Labia:        Right: No rash, tenderness, lesion or injury.        Left: No rash, tenderness, lesion or injury.      Vagina: No signs of injury. Vaginal discharge (malodorous, thin, white) present. No erythema, tenderness, bleeding, lesions or prolapsed vaginal walls.     Comments: Wet prep swab obtained Musculoskeletal: Normal range of motion.        General: No deformity.  Lymphadenopathy:     Cervical: No cervical adenopathy.  Skin:    General: Skin is warm and dry.     Capillary Refill: Capillary refill takes less than 2 seconds.     Coloration: Skin is not pale.     Findings: No rash.  Neurological:     Mental Status: She is alert and oriented to person, place, and time.     Motor: No abnormal muscle tone.     Coordination: Coordination normal.     Gait: Gait normal.  Psychiatric:        Speech: Speech normal.        Behavior: Behavior normal.       UA with  +1 leuks, trace blood  Microscopy pending  Wet prep + clue cells     Assessment & Plan:   43 y/o female, sexually active with husband, monogamous, complains of vaginal odor and discharge x 1 month and dysuria x 1 week.    UA unremarkable, Wet prep + for BV Pt PE unremarkable - no abd ttp Low risk for STD Will tx bv and wait on treating for uti - culture pending If any recurrent or worsening sx, I have recommended to pt to f/up and have full pelvic exam and additional testing done.     ICD-10-CM   1. BV (bacterial vaginosis) N76.0 WET PREP FOR TRICH, YEAST, CLUE   B96.89    tx with flagyl, f/up if no improvement  2. Dysuria R30.0 Urinalysis, Routine w reflex microscopic    Urine Culture   will wait for urine culture to determine tx       Delsa Grana, PA-C 09/20/18 9:00 AM

## 2018-09-22 LAB — URINE CULTURE
MICRO NUMBER:: 166694
SPECIMEN QUALITY:: ADEQUATE

## 2018-09-23 ENCOUNTER — Encounter (HOSPITAL_COMMUNITY): Payer: Self-pay | Admitting: Emergency Medicine

## 2018-09-23 ENCOUNTER — Emergency Department (HOSPITAL_COMMUNITY): Payer: BLUE CROSS/BLUE SHIELD

## 2018-09-23 ENCOUNTER — Emergency Department (HOSPITAL_COMMUNITY)
Admission: EM | Admit: 2018-09-23 | Discharge: 2018-09-24 | Disposition: A | Discharge status: Home / Self Care | Payer: BLUE CROSS/BLUE SHIELD | Attending: Emergency Medicine | Admitting: Emergency Medicine

## 2018-09-23 ENCOUNTER — Other Ambulatory Visit: Payer: Self-pay

## 2018-09-23 DIAGNOSIS — S161XXA Strain of muscle, fascia and tendon at neck level, initial encounter: Secondary | ICD-10-CM | POA: Insufficient documentation

## 2018-09-23 DIAGNOSIS — Y998 Other external cause status: Secondary | ICD-10-CM | POA: Insufficient documentation

## 2018-09-23 DIAGNOSIS — Y9389 Activity, other specified: Secondary | ICD-10-CM | POA: Diagnosis not present

## 2018-09-23 DIAGNOSIS — S199XXA Unspecified injury of neck, initial encounter: Secondary | ICD-10-CM | POA: Diagnosis present

## 2018-09-23 DIAGNOSIS — Y92410 Unspecified street and highway as the place of occurrence of the external cause: Secondary | ICD-10-CM | POA: Diagnosis not present

## 2018-09-23 DIAGNOSIS — M25511 Pain in right shoulder: Secondary | ICD-10-CM | POA: Diagnosis not present

## 2018-09-23 DIAGNOSIS — R51 Headache: Secondary | ICD-10-CM | POA: Diagnosis not present

## 2018-09-23 DIAGNOSIS — M25521 Pain in right elbow: Secondary | ICD-10-CM | POA: Diagnosis not present

## 2018-09-23 DIAGNOSIS — Z859 Personal history of malignant neoplasm, unspecified: Secondary | ICD-10-CM | POA: Insufficient documentation

## 2018-09-23 DIAGNOSIS — W2212XA Striking against or struck by front passenger side automobile airbag, initial encounter: Secondary | ICD-10-CM | POA: Diagnosis not present

## 2018-09-23 MED ORDER — OXYCODONE-ACETAMINOPHEN 5-325 MG PO TABS
1.0000 | ORAL_TABLET | Freq: Once | ORAL | Status: AC
  Administered 2018-09-24: 1 via ORAL
  Filled 2018-09-23: qty 1
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
## 2018-09-24 ENCOUNTER — Emergency Department (HOSPITAL_COMMUNITY): Payer: BLUE CROSS/BLUE SHIELD

## 2018-09-24 ENCOUNTER — Other Ambulatory Visit: Payer: Self-pay | Admitting: Family Medicine

## 2018-09-24 MED ORDER — CYCLOBENZAPRINE HCL 10 MG PO TABS
10.0000 mg | ORAL_TABLET | Freq: Once | ORAL | Status: AC
  Administered 2018-09-24: 10 mg via ORAL
  Filled 2018-09-24: qty 1

## 2018-09-24 MED ORDER — KETOROLAC TROMETHAMINE 30 MG/ML IJ SOLN
30.0000 mg | Freq: Once | INTRAMUSCULAR | Status: AC
Start: 2018-09-24 — End: 2018-09-24
  Administered 2018-09-24: 30 mg via INTRAMUSCULAR
  Filled 2018-09-24: qty 1

## 2018-09-24 MED ORDER — OXYCODONE-ACETAMINOPHEN 5-325 MG PO TABS: 1.0000 | ORAL_TABLET | Freq: Three times a day (TID) | ORAL | 0 refills | Status: DC | PRN

## 2018-09-24 MED ORDER — FLUCONAZOLE 150 MG PO TABS: 150.0000 mg | ORAL_TABLET | Freq: Once | ORAL | 0 refills | Status: AC

## 2018-09-24 MED ORDER — CEPHALEXIN 500 MG PO CAPS: 500.0000 mg | ORAL_CAPSULE | Freq: Four times a day (QID) | ORAL | 0 refills | Status: DC

## 2018-09-24 NOTE — Addendum Note (Signed)
Addended by: Delsa Grana on: 09/24/2018 11:41 AM   Modules accepted: Orders
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
## 2018-09-25 ENCOUNTER — Other Ambulatory Visit: Payer: Self-pay

## 2018-09-25 MED ORDER — SULFAMETHOXAZOLE-TRIMETHOPRIM 800-160 MG PO TABS: 1.0000 | ORAL_TABLET | Freq: Two times a day (BID) | ORAL | 0 refills | Status: AC
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
## 2019-06-27 ENCOUNTER — Other Ambulatory Visit: Payer: Self-pay

## 2019-06-30 ENCOUNTER — Ambulatory Visit (INDEPENDENT_AMBULATORY_CARE_PROVIDER_SITE_OTHER): Payer: BC Managed Care – PPO | Admitting: Family Medicine

## 2019-06-30 ENCOUNTER — Other Ambulatory Visit: Payer: Self-pay

## 2019-06-30 ENCOUNTER — Encounter: Payer: Self-pay | Admitting: Family Medicine

## 2019-06-30 VITALS — BP 118/62 | HR 80 | Temp 98.1°F | Resp 14 | Ht 62.0 in | Wt 173.0 lb

## 2019-06-30 DIAGNOSIS — N760 Acute vaginitis: Secondary | ICD-10-CM | POA: Diagnosis not present

## 2019-06-30 DIAGNOSIS — B9689 Other specified bacterial agents as the cause of diseases classified elsewhere: Secondary | ICD-10-CM | POA: Diagnosis not present

## 2019-06-30 LAB — WET PREP FOR TRICH, YEAST, CLUE

## 2019-06-30 MED ORDER — METRONIDAZOLE 500 MG PO TABS: 500.0000 mg | ORAL_TABLET | Freq: Two times a day (BID) | ORAL | 0 refills | Status: DC

## 2019-06-30 NOTE — Progress Notes (Signed)
   Subjective:    Patient ID: Megan Harvey, female    DOB: 28-Nov-1975, 43 y.o.   MRN: [id number]  Patient presents for Vaginititis (x4 days- odor, yellow to white discharge)   Here with vaginal discharge for the past 4 days.  Has a white-yellow discharge with odor no itching no current bleeding.  She did have a menses that lasted about 2 weeks last month.  Otherwise they have been normal.  She denies any abdominal pain no dysuria no change in her bowels.  No new sexual partners.  Review Of Systems:  GEN- denies fatigue, fever, weight loss,weakness, recent illness HEENT- denies eye drainage, change in vision, nasal discharge, CVS- denies chest pain, palpitations RESP- denies SOB, cough, wheeze ABD- denies N/V, change in stools, abd pain GU- denies dysuria, hematuria, dribbling, incontinence MSK- denies joint pain, muscle aches, injury Neuro- denies headache, dizziness, syncope, seizure activity       Objective:    BP 118/62   Pulse 80   Temp 98.1 F (36.7 C) (Temporal)   Resp 14   Ht 5\' 2"  (1.575 m)   Wt 173 lb (78.5 kg)   SpO2 98%   BMI 31.64 kg/m  GEN- NAD, alert and oriented x3 CVS- RRR, no murmur RESP-CTAB ABD-NABS,soft,NT,ND GU- normal external genitalia, vaginal mucosa pink and moist, cervix visualized no growth, no blood form os, + discharge, no CMT, no ovarian masses, uterus normal size         Assessment & Plan:      Problem List Items Addressed This Visit    None    Visit Diagnoses    BV (bacterial vaginosis)    -  Primary   Bacterial vaginosis noted treat with Flagyl.  She will monitor her cycles see if she has another prolonged menstrual cycle then we will get her appointment with GYN for dysfunctional uterine bleeding.  She will return for complete physical lab fasting labs.  Her GC is pending.  Note patient did state that she douches advised that she should not use this method of cleaning as it can affect her pH balance in the vaginal area   Relevant  Medications   metroNIDAZOLE (FLAGYL) 500 MG tablet   Other Relevant Orders   WET PREP FOR [place], YEAST, CLUE (Completed)   C. trachomatis/N. gonorrhoeae RNA      Note: This dictation was prepared with Dragon dictation along with smaller Company secretary. Any transcriptional errors that result from this process are unintentional.

## 2019-06-30 NOTE — Patient Instructions (Addendum)
F/U schedule a physical

## 2019-07-01 ENCOUNTER — Encounter: Payer: Self-pay | Admitting: Family Medicine

## 2019-07-01 LAB — C. TRACHOMATIS/N. GONORRHOEAE RNA
C. trachomatis RNA, TMA: NOT DETECTED
N. gonorrhoeae RNA, TMA: NOT DETECTED

## 2019-07-25 ENCOUNTER — Ambulatory Visit (INDEPENDENT_AMBULATORY_CARE_PROVIDER_SITE_OTHER): Payer: BC Managed Care – PPO | Admitting: Family Medicine

## 2019-07-25 ENCOUNTER — Other Ambulatory Visit: Payer: Self-pay

## 2019-07-25 ENCOUNTER — Encounter: Payer: Self-pay | Admitting: Family Medicine

## 2019-07-25 VITALS — BP 110/64 | HR 76 | Temp 98.4°F | Resp 12 | Ht 62.0 in | Wt 173.0 lb

## 2019-07-25 DIAGNOSIS — N390 Urinary tract infection, site not specified: Secondary | ICD-10-CM

## 2019-07-25 LAB — URINALYSIS, ROUTINE W REFLEX MICROSCOPIC
Bilirubin Urine: NEGATIVE
Glucose, UA: NEGATIVE
Hgb urine dipstick: NEGATIVE
Hyaline Cast: NONE SEEN /LPF
Ketones, ur: NEGATIVE
Nitrite: NEGATIVE
Specific Gravity, Urine: 1.024 (ref 1.001–1.03)
pH: 5.5 (ref 5.0–8.0)

## 2019-07-25 LAB — MICROSCOPIC MESSAGE

## 2019-07-25 MED ORDER — NITROFURANTOIN MONOHYD MACRO 100 MG PO CAPS: 100.0000 mg | ORAL_CAPSULE | Freq: Two times a day (BID) | ORAL | 0 refills | Status: DC

## 2019-07-25 MED ORDER — FLUCONAZOLE 150 MG PO TABS: 150.0000 mg | ORAL_TABLET | Freq: Once | ORAL | 0 refills | Status: AC

## 2019-07-25 NOTE — Patient Instructions (Signed)
F/u AS NEEDED Take antibiotics and yeast pill

## 2019-07-25 NOTE — Progress Notes (Signed)
   Subjective:    Patient ID: Megan Harvey, female    DOB: 23-Oct-1975, 43 y.o.   MRN: [id number]  Patient presents for Dysuria (x 2 weeks- burning with urination, lower abd pressure, urinary frequency)  Pt here with dysuria, pressure for the past 3 weeks. Started finishing the flagyl. No further vaginal discharge  She has suprapubic pain and pressure with uriting, urinating frequentl, today has a little low back discomfort   has some hot flashes, no fever , no vomiting     LMP in Nov      Review Of Systems:  GEN- denies fatigue, fever, weight loss,weakness, recent illness HEENT- denies eye drainage, change in vision, nasal discharge, CVS- denies chest pain, palpitations RESP- denies SOB, cough, wheeze ABD- denies N/V, change in stools, abd pain GU-+ dysuria, hematuria, dribbling, incontinence MSK- denies joint pain, muscle aches, injury Neuro- denies headache, dizziness, syncope, seizure activity       Objective:    BP 110/64   Pulse 76   Temp 98.4 F (36.9 C) (Temporal)   Resp 12   Ht 5\' 2"  (1.575 m)   Wt 173 lb (78.5 kg)   SpO2 99%   BMI 31.64 kg/m  GEN- NAD, alert and oriented x3 CVS- RRR, no murmur RESP-CTAB ABD-NABS,soft,NT,ND, no CVA tenderness EXT- No edema Pulses- Radial  2+        Assessment & Plan:      Problem List Items Addressed This Visit    None    Visit Diagnoses    Urinary tract infection without hematuria, site unspecified    -  Primary   Treat macrobid x 7 days, and diflucan given for yeast infection after antibitioics, since she has been on 2 rounds of meds recommend probiotics   Relevant Medications   fluconazole (DIFLUCAN) 150 MG tablet   nitrofurantoin, macrocrystal-monohydrate, (MACROBID) 100 MG capsule   Other Relevant Orders   Urinalysis, Routine w reflex microscopic (Completed)      Note: This dictation was prepared with Dragon dictation along with smaller phrase technology. Any transcriptional errors that result from this  process are unintentional.

## 2019-10-08 ENCOUNTER — Encounter: Payer: BC Managed Care – PPO | Admitting: Family Medicine

## 2020-01-01 ENCOUNTER — Ambulatory Visit: Payer: Self-pay | Attending: Family

## 2020-01-01 DIAGNOSIS — Z23 Encounter for immunization: Secondary | ICD-10-CM

## 2020-01-01 NOTE — Progress Notes (Signed)
   Covid-19 Vaccination Clinic  Name:  Megan Harvey    MRN: [id number] DOB: [birth year]  01/01/2020  [name] was observed post Covid-19 immunization for 15 minutes without incident. She was provided with Vaccine Information Sheet and instruction to access the V-Safe system.   [name] was instructed to call 911 with any severe reactions post vaccine: Marland Kitchen Difficulty breathing  . Swelling of face and throat  . A fast heartbeat  . A bad rash all over body  . Dizziness and weakness   Immunizations Administered    Name Date Dose VIS Date Route   Moderna COVID-19 Vaccine 01/01/2020  4:28 PM 0.5 mL 07/2019 Intramuscular   Manufacturer: Moderna   [id number]   [id number]

## 2020-01-09 ENCOUNTER — Other Ambulatory Visit: Payer: Self-pay

## 2020-01-09 ENCOUNTER — Emergency Department (HOSPITAL_COMMUNITY)
Admission: EM | Admit: 2020-01-09 | Discharge: 2020-01-09 | Disposition: A | Discharge status: Home / Self Care | Payer: Self-pay | Attending: Emergency Medicine | Admitting: Emergency Medicine

## 2020-01-09 ENCOUNTER — Emergency Department (HOSPITAL_COMMUNITY): Payer: Self-pay

## 2020-01-09 ENCOUNTER — Encounter (HOSPITAL_COMMUNITY): Payer: Self-pay | Admitting: Emergency Medicine

## 2020-01-09 DIAGNOSIS — Z20822 Contact with and (suspected) exposure to covid-19: Secondary | ICD-10-CM | POA: Insufficient documentation

## 2020-01-09 DIAGNOSIS — J45909 Unspecified asthma, uncomplicated: Secondary | ICD-10-CM | POA: Insufficient documentation

## 2020-01-09 DIAGNOSIS — J069 Acute upper respiratory infection, unspecified: Secondary | ICD-10-CM | POA: Insufficient documentation

## 2020-01-09 LAB — SARS CORONAVIRUS 2 (TAT 6-24 HRS): SARS Coronavirus 2: NEGATIVE

## 2020-01-09 MED ORDER — DEXAMETHASONE 4 MG PO TABS
12.0000 mg | ORAL_TABLET | Freq: Once | ORAL | Status: AC
  Administered 2020-01-09: 12 mg via ORAL
  Filled 2020-01-09: qty 3

## 2020-01-09 MED ORDER — ONDANSETRON 4 MG PO TBDP
4.0000 mg | ORAL_TABLET | Freq: Once | ORAL | Status: AC
  Administered 2020-01-09: 4 mg via ORAL
  Filled 2020-01-09: qty 1

## 2020-01-09 MED ORDER — IBUPROFEN 400 MG PO TABS
600.0000 mg | ORAL_TABLET | Freq: Once | ORAL | Status: AC
  Administered 2020-01-09: 600 mg via ORAL
  Filled 2020-01-09: qty 1

## 2020-01-09 NOTE — ED Triage Notes (Signed)
Pt states she got 1st covid vaccine last weekend, about 2 days after she began having cough, sore throat, body aches. Denies any recent sick contacts on or before vaccine.

## 2020-01-15 NOTE — ED Provider Notes (Signed)
[facility] EMERGENCY DEPARTMENT Provider Note   CSN: [id number] Arrival date & time: 01/09/20  [id number]     History Chief Complaint  Patient presents with  . Sore Throat  . Generalized Body Aches  . Cough    Megan Harvey is a 44 y.o. female.  HPI   44 year old female with body aches, sore throat and cough.  She attributes to symptoms to recent Covid vaccination but that was over a week ago.  No sick contacts that he is aware of.  No fevers.  No urinary complaints.  No shortness of breath.  Past Medical History:  Diagnosis Date  . Allergy   . Asthma    childhood  . Seizures [facility])     Patient Active Problem List   Diagnosis Date Noted  . Allergy   . Asthma   . Cancer ([place])   . Seizure ([place]) 02/21/2015    Past Surgical History:  Procedure Laterality Date  . BILATERAL CARPAL TUNNEL RELEASE       OB History    Gravida  0   Para  0   Term  0   Preterm  0   AB  0   Living        SAB  0   TAB  0   Ectopic  0   Multiple      Live Births              Family History  Problem Relation Age of Onset  . Heart disease Mother   . Hypertension Mother   . Diabetes Mother   . Arthritis Mother   . Hyperlipidemia Mother   . Heart disease Father   . Hypertension Father   . Diabetes Father   . Diabetes Maternal Grandmother   . Diabetes Paternal Grandmother   . Cancer Paternal Grandmother   . Alcohol abuse Sister   . Alcohol abuse Brother   . Alcohol abuse Brother   . Alcohol abuse Daughter     Social History   Tobacco Use  . Smoking status: Never Smoker  . Smokeless tobacco: Never Used  Substance Use Topics  . Alcohol use: No  . Drug use: No    Home Medications Prior to Admission medications   Medication Sig Start Date End Date Taking? Authorizing Provider  ibuprofen (ADVIL,MOTRIN) 600 MG tablet Take 1 tablet (600 mg total) by mouth every 6 (six) hours as needed. 05/14/17   Noe Gens, PA-C  nitrofurantoin,  macrocrystal-monohydrate, (MACROBID) 100 MG capsule Take 1 capsule (100 mg total) by mouth 2 (two) times daily. 07/25/19   Alycia Rossetti, MD    Allergies    Penicillins, Bee venom, and Penicillins  Review of Systems   Review of Systems All systems reviewed and negative, other than as noted in HPI.  Physical Exam Updated Vital Signs BP 120/68   Pulse 92   Temp 98.9 F (37.2 C) (Oral)   Resp 14   Ht 5\' 2"  (1.575 m)   Wt 78.5 kg   SpO2 99%   BMI 31.64 kg/m   Physical Exam Vitals and nursing note reviewed.  Constitutional:      General: She is not in acute distress.    Appearance: She is well-developed.  HENT:     Head: Normocephalic and atraumatic.  Eyes:     General:        Right eye: No discharge.        Left eye: No discharge.  Conjunctiva/sclera: Conjunctivae normal.  Cardiovascular:     Rate and Rhythm: Normal rate and regular rhythm.     Heart sounds: Normal heart sounds. No murmur. No friction rub. No gallop.   Pulmonary:     Effort: Pulmonary effort is normal. No respiratory distress.     Breath sounds: Normal breath sounds.  Abdominal:     General: There is no distension.     Palpations: Abdomen is soft.     Tenderness: There is no abdominal tenderness.  Musculoskeletal:        General: No tenderness.     Cervical back: Neck supple.  Skin:    General: Skin is warm and dry.  Neurological:     Mental Status: She is alert.  Psychiatric:        Behavior: Behavior normal.        Thought Content: Thought content normal.     ED Results / Procedures / Treatments   Labs (all labs ordered are listed, but only abnormal results are displayed) Labs Reviewed  SARS CORONAVIRUS 2 (TAT 6-24 HRS)    EKG None  Radiology No results found.  Procedures Procedures (including critical care time)  Medications Ordered in ED Medications  dexamethasone (DECADRON) tablet 12 mg (12 mg Oral Given 01/09/20 0854)  ondansetron (ZOFRAN-ODT) disintegrating tablet  4 mg (4 mg Oral Given 01/09/20 0855)  ibuprofen (ADVIL) tablet 600 mg (600 mg Oral Given 01/09/20 [id number])    ED Course  I have reviewed the triage vital signs and the nursing notes.  Pertinent labs & imaging results that were available during my care of the patient were reviewed by me and considered in my medical decision making (see chart for details).    MDM Rules/Calculators/A&P                      44 year old female with what I suspect may be a viral illness.  Very low suspicion for serious bacterial infection or other emergent process.  Plan symptomatic treatment.  Return precautions discussed.  Outpatient follow-up otherwise.  Final Clinical Impression(s) / ED Diagnoses Final diagnoses:  Viral upper respiratory tract infection    Rx / DC Orders ED Discharge Orders    None       Virgel Manifold, MD 01/15/20 1024

## 2020-01-27 ENCOUNTER — Ambulatory Visit: Payer: Self-pay | Attending: Family

## 2020-01-27 DIAGNOSIS — Z23 Encounter for immunization: Secondary | ICD-10-CM

## 2020-01-27 NOTE — Progress Notes (Signed)
   Covid-19 Vaccination Clinic  Name:  CHISA KUSHNER    MRN: [id number] DOB: [birth year]  01/27/2020  Ms. Reynolds was observed post Covid-19 immunization for 15 minutes without incident. She was provided with Vaccine Information Sheet and instruction to access the V-Safe system.   Ms. Hopfer was instructed to call 911 with any severe reactions post vaccine: Marland Kitchen Difficulty breathing  . Swelling of face and throat  . A fast heartbeat  . A bad rash all over body  . Dizziness and weakness   Immunizations Administered    Name Date Dose VIS Date Route   Moderna COVID-19 Vaccine 01/27/2020  4:25 PM 0.5 mL 07/2019 Intramuscular   Manufacturer: Moderna   Lot: [id number]   [place]: [id number]

## 2020-01-29 ENCOUNTER — Ambulatory Visit: Payer: Self-pay

## 2020-03-29 ENCOUNTER — Other Ambulatory Visit: Payer: Self-pay

## 2020-03-29 ENCOUNTER — Ambulatory Visit: Payer: BC Managed Care – PPO | Admitting: Family Medicine

## 2020-03-29 ENCOUNTER — Encounter: Payer: Self-pay | Admitting: Family Medicine

## 2020-03-29 VITALS — BP 110/74 | HR 85 | Temp 98.5°F | Wt 175.0 lb

## 2020-03-29 DIAGNOSIS — R140 Abdominal distension (gaseous): Secondary | ICD-10-CM | POA: Diagnosis not present

## 2020-03-29 DIAGNOSIS — R300 Dysuria: Secondary | ICD-10-CM | POA: Diagnosis not present

## 2020-03-29 DIAGNOSIS — F5104 Psychophysiologic insomnia: Secondary | ICD-10-CM

## 2020-03-29 DIAGNOSIS — E669 Obesity, unspecified: Secondary | ICD-10-CM | POA: Diagnosis not present

## 2020-03-29 DIAGNOSIS — R232 Flushing: Secondary | ICD-10-CM

## 2020-03-29 DIAGNOSIS — N926 Irregular menstruation, unspecified: Secondary | ICD-10-CM

## 2020-03-29 DIAGNOSIS — E66811 Obesity, class 1: Secondary | ICD-10-CM | POA: Insufficient documentation

## 2020-03-29 LAB — PREGNANCY, URINE: Preg Test, Ur: NEGATIVE

## 2020-03-29 MED ORDER — TRAZODONE HCL 50 MG PO TABS: 25.0000 mg | ORAL_TABLET | Freq: Every evening | ORAL | 3 refills | Status: DC | PRN

## 2020-03-29 NOTE — Patient Instructions (Signed)
F/U pending results   

## 2020-03-29 NOTE — Progress Notes (Signed)
Subjective:    Patient ID: Megan Harvey, female    DOB: 03/13/1976, 44 y.o.   MRN: [id number]  Patient presents for Hot Flashes, Headache, and Insomnia   Pt here due to concern for menopause  No menstrual cycle in the past 5 months or so   denies pregnancy She gets severe hot flashes and feels flushed all over.  Often she is drinking sweat.  She also gets very moody and feels depressed.  She cannot sleep.  Not slept very well in the past couple of weeks.  She became very tearful in the office describing her symptoms.  Also feels bloated after eating small neuropathy.  She started exercising regularly with eating smaller portions and lost 24 pounds but then gained it all back.  Denies any pain with eating but just gets bloated very easily.   Mother went into early menopause inher 34's   Working first shift 8 hours    She also had a few episodes of dysuria, she increased water and cranberry and that helped   Taking MVI    Last PAP  2016 had a Pap on file.  She was evaluated by GYN for dysfunctional uterine bleeding in the past.   Review Of Systems:  GEN- denies fatigue, fever, weight loss,weakness, recent illness HEENT- denies eye drainage, change in vision, nasal discharge, CVS- denies chest pain, palpitations RESP- denies SOB, cough, wheeze ABD- denies N/V, change in stools, abd pain GU- denies dysuria, hematuria, dribbling, incontinence MSK- denies joint pain, muscle aches, injury Neuro- denies headache, dizziness, syncope, seizure activity       Objective:    BP 110/74   Pulse 85   Temp 98.5 F (36.9 C)   Wt 175 lb (79.4 kg)   SpO2 99%   BMI 32.01 kg/m  GEN- NAD, alert and oriented x3, broke out in sweat during visit, fanning herself  HEENT- PERRL, EOMI, non injected sclera, pink conjunctiva, MMM, oropharynx clear Neck- Supple, no thyromegaly CVS- RRR, no murmur RESP-CTAB ABD-NABS,soft,NT,ND Psych- depressed affect, tearful, no SI, well groomed,good eye  contact  EXT- No edema Pulses- Radial, DP- 2+        Assessment & Plan:      Problem List Items Addressed This Visit      Unprioritized   Class 1 obesity    Patient here with a multitude of different symptoms that are  concerning for possible early menopause.  She has mood swings she has hot flashes has not had a cycle in at least 5 or 6 months.  Urine pregnancy test is negative.  I will check labs including her hormone levels and TSH.  I think that we may need gynecology involved.  If she is not sleeping very well and having the mood swings we will start her on trazodone at bedtime.  Bloating I will check a lipase her abdominal exam is benign.  I will also check a UA because of her recent dysuria symptoms.        Other Visit Diagnoses    Hot flashes    -  Primary   Relevant Orders   CBC with Differential/Platelet (Completed)   Comprehensive metabolic panel (Completed)   TSH (Completed)   FSH/LH (Completed)   Lipase (Completed)   Hemoglobin A1c (Completed)   Missed menses       Relevant Orders   TSH (Completed)   FSH/LH (Completed)   Pregnancy, urine (Completed)   Dysuria       Relevant Orders  Urinalysis, Routine w reflex microscopic (Completed)   Abdominal bloating       Relevant Orders   Lipase (Completed)   Psychophysiological insomnia       start trazodone       Note: This dictation was prepared with Dragon dictation along with smaller phrase technology. Any transcriptional errors that result from this process are unintentional.

## 2020-03-30 ENCOUNTER — Encounter: Payer: Self-pay | Admitting: Family Medicine

## 2020-03-30 ENCOUNTER — Telehealth: Payer: Self-pay | Admitting: Family Medicine

## 2020-03-30 LAB — COMPREHENSIVE METABOLIC PANEL
AG Ratio: 1.6 (calc) (ref 1.0–2.5)
ALT: 22 U/L (ref 6–29)
AST: 19 U/L (ref 10–30)
Albumin: 4.3 g/dL (ref 3.6–5.1)
Alkaline phosphatase (APISO): 81 U/L (ref 31–125)
BUN: 19 mg/dL (ref 7–25)
CO2: 30 mmol/L (ref 20–32)
Calcium: 9.9 mg/dL (ref 8.6–10.2)
Chloride: 102 mmol/L (ref 98–110)
Creat: 0.79 mg/dL (ref 0.50–1.10)
Globulin: 2.7 g/dL (calc) (ref 1.9–3.7)
Glucose, Bld: 68 mg/dL (ref 65–99)
Potassium: 4.2 mmol/L (ref 3.5–5.3)
Sodium: 138 mmol/L (ref 135–146)
Total Bilirubin: 0.4 mg/dL (ref 0.2–1.2)
Total Protein: 7 g/dL (ref 6.1–8.1)

## 2020-03-30 LAB — FSH/LH
FSH: 54.7 m[IU]/mL
LH: 40.3 m[IU]/mL

## 2020-03-30 LAB — HEMOGLOBIN A1C
Hgb A1c MFr Bld: 5.8 % of total Hgb — ABNORMAL HIGH (ref <5.7)
Mean Plasma Glucose: 120 (calc)
eAG (mmol/L): 6.6 (calc)

## 2020-03-30 LAB — CBC WITH DIFFERENTIAL/PLATELET
Absolute Monocytes: 744 cells/uL (ref 200–950)
Basophils Absolute: 40 cells/uL (ref 0–200)
Basophils Relative: 0.5 %
Eosinophils Absolute: 632 cells/uL — ABNORMAL HIGH (ref 15–500)
Eosinophils Relative: 7.9 %
HCT: 39.8 % (ref 35.0–45.0)
Hemoglobin: 13.2 g/dL (ref 11.7–15.5)
Lymphs Abs: 2304 cells/uL (ref 850–3900)
MCH: 30.4 pg (ref 27.0–33.0)
MCHC: 33.2 g/dL (ref 32.0–36.0)
MCV: 91.7 fL (ref 80.0–100.0)
MPV: 10.4 fL (ref 7.5–12.5)
Monocytes Relative: 9.3 %
Neutro Abs: 4280 cells/uL (ref 1500–7800)
Neutrophils Relative %: 53.5 %
Platelets: 302 10*3/uL (ref 140–400)
RBC: 4.34 10*6/uL (ref 3.80–5.10)
RDW: 12.2 % (ref 11.0–15.0)
Total Lymphocyte: 28.8 %
WBC: 8 10*3/uL (ref 3.8–10.8)

## 2020-03-30 LAB — URINALYSIS, ROUTINE W REFLEX MICROSCOPIC
Bilirubin Urine: NEGATIVE
Glucose, UA: NEGATIVE
Hgb urine dipstick: NEGATIVE
Ketones, ur: NEGATIVE
Leukocytes,Ua: NEGATIVE
Nitrite: NEGATIVE
Protein, ur: NEGATIVE
Specific Gravity, Urine: 1.026 (ref 1.001–1.03)
pH: <=5 (ref 5.0–8.0)

## 2020-03-30 LAB — LIPASE: Lipase: 21 U/L (ref 7–60)

## 2020-03-30 LAB — TSH: TSH: 1.15 mIU/L

## 2020-03-30 NOTE — Assessment & Plan Note (Addendum)
Patient here with a multitude of different symptoms that are  concerning for possible early menopause.  She has mood swings she has hot flashes has not had a cycle in at least 5 or 6 months.  Urine pregnancy test is negative.  I will check labs including her hormone levels and TSH.  I think that we may need gynecology involved.  If she is not sleeping very well and having the mood swings we will start her on trazodone at bedtime.  Bloating I will check a lipase her abdominal exam is benign.  I will also check a UA because of her recent dysuria symptoms.

## 2020-03-30 NOTE — Telephone Encounter (Signed)
CB# [phone] Having hot flashes would like see if Dr.Richmond West can prescribe medication  to the pharmacy also lab results in.

## 2020-03-31 ENCOUNTER — Other Ambulatory Visit: Payer: Self-pay | Admitting: *Deleted

## 2020-03-31 DIAGNOSIS — R232 Flushing: Secondary | ICD-10-CM

## 2020-03-31 DIAGNOSIS — N951 Menopausal and female climacteric states: Secondary | ICD-10-CM

## 2020-03-31 MED ORDER — METFORMIN HCL 500 MG PO TABS
500.0000 mg | ORAL_TABLET | Freq: Every day | ORAL | 3 refills | Status: DC
Start: 2020-03-31 — End: 2020-04-01

## 2020-03-31 NOTE — Telephone Encounter (Signed)
CB# [phone] Call for Lab results. Would like to know if Dr.Effort sen rx in for her hot flashes

## 2020-03-31 NOTE — Telephone Encounter (Signed)
Please see lab results for further information.  

## 2020-04-01 ENCOUNTER — Other Ambulatory Visit: Payer: Self-pay | Admitting: *Deleted

## 2020-04-01 MED ORDER — METFORMIN HCL 500 MG PO TABS: 500.0000 mg | ORAL_TABLET | Freq: Every day | ORAL | 3 refills | Status: DC

## 2020-04-23 ENCOUNTER — Ambulatory Visit: Payer: BC Managed Care – PPO | Admitting: Family Medicine

## 2020-04-23 DIAGNOSIS — R61 Generalized hyperhidrosis: Secondary | ICD-10-CM | POA: Diagnosis not present

## 2020-04-23 DIAGNOSIS — E119 Type 2 diabetes mellitus without complications: Secondary | ICD-10-CM | POA: Insufficient documentation

## 2020-04-23 DIAGNOSIS — N951 Menopausal and female climacteric states: Secondary | ICD-10-CM | POA: Diagnosis not present

## 2020-06-21 ENCOUNTER — Ambulatory Visit (INDEPENDENT_AMBULATORY_CARE_PROVIDER_SITE_OTHER): Payer: BC Managed Care – PPO | Admitting: Family Medicine

## 2020-06-21 ENCOUNTER — Encounter: Payer: Self-pay | Admitting: Family Medicine

## 2020-06-21 ENCOUNTER — Other Ambulatory Visit: Payer: Self-pay

## 2020-06-21 VITALS — BP 112/64 | HR 70 | Temp 98.5°F | Resp 14 | Ht 62.0 in | Wt 167.0 lb

## 2020-06-21 DIAGNOSIS — G4700 Insomnia, unspecified: Secondary | ICD-10-CM | POA: Insufficient documentation

## 2020-06-21 DIAGNOSIS — Z113 Encounter for screening for infections with a predominantly sexual mode of transmission: Secondary | ICD-10-CM | POA: Diagnosis not present

## 2020-06-21 DIAGNOSIS — R7303 Prediabetes: Secondary | ICD-10-CM | POA: Diagnosis not present

## 2020-06-21 DIAGNOSIS — Z0001 Encounter for general adult medical examination with abnormal findings: Secondary | ICD-10-CM

## 2020-06-21 DIAGNOSIS — Z23 Encounter for immunization: Secondary | ICD-10-CM

## 2020-06-21 DIAGNOSIS — Z0000 Encounter for general adult medical examination without abnormal findings: Secondary | ICD-10-CM

## 2020-06-21 DIAGNOSIS — F5101 Primary insomnia: Secondary | ICD-10-CM

## 2020-06-21 DIAGNOSIS — N951 Menopausal and female climacteric states: Secondary | ICD-10-CM

## 2020-06-21 DIAGNOSIS — Z124 Encounter for screening for malignant neoplasm of cervix: Secondary | ICD-10-CM

## 2020-06-21 DIAGNOSIS — Z1159 Encounter for screening for other viral diseases: Secondary | ICD-10-CM | POA: Diagnosis not present

## 2020-06-21 DIAGNOSIS — E669 Obesity, unspecified: Secondary | ICD-10-CM

## 2020-06-21 DIAGNOSIS — R232 Flushing: Secondary | ICD-10-CM

## 2020-06-21 DIAGNOSIS — Z1231 Encounter for screening mammogram for malignant neoplasm of breast: Secondary | ICD-10-CM

## 2020-06-21 LAB — WET PREP FOR TRICH, YEAST, CLUE

## 2020-06-21 MED ORDER — TRAZODONE HCL 50 MG PO TABS: 50.0000 mg | ORAL_TABLET | Freq: Every evening | ORAL | 1 refills | Status: DC | PRN

## 2020-06-21 NOTE — Assessment & Plan Note (Signed)
Discussed where to find black cohosh to try

## 2020-06-21 NOTE — Patient Instructions (Addendum)
F/U 6 months  Schedule your mammogram We will call with lab results Flu and TDAP ( tetanus)  given

## 2020-06-21 NOTE — Assessment & Plan Note (Signed)
She is doing well with metformin for her glucose intolerance and weight loss

## 2020-06-21 NOTE — Progress Notes (Signed)
Subjective:    Patient ID: Megan Harvey, female    DOB: [birth year], 44 y.o.   MRN: [id number]  Patient presents for Gynecologic Exam (is fasting) and STD Check  Pt here for wellness visit Due for PAP Smear  She still has hot flashes, taking MVI and occ B12 supplement  She was told to take different OTC meds, black cohosh she couldn't find She has not had menses all year   Working out 5 days a week, on average 1 hour    Borderline DM- last 1C  5.8% She is on metformin 500mg  once a day , Her weight is down 8lbs in 2.5 months   Trazodone for sleep   No concerns with dental or vision    Review Of Systems:  GEN- denies fatigue, fever, weight loss,weakness, recent illness HEENT- denies eye drainage, change in vision, nasal discharge, CVS- denies chest pain, palpitations RESP- denies SOB, cough, wheeze ABD- denies N/V, change in stools, abd pain GU- denies dysuria, hematuria, dribbling, incontinence MSK- denies joint pain, muscle aches, injury Neuro- denies headache, dizziness, syncope, seizure activity       Objective:    BP 112/64   Pulse 70   Temp 98.5 F (36.9 C) (Temporal)   Resp 14   Ht 5\' 2"  (1.575 m)   Wt 167 lb (75.8 kg)   SpO2 96%   BMI 30.54 kg/m  GEN- NAD, alert and oriented x3 HEENT- PERRL, EOMI, non injected sclera, pink conjunctiva, MMM, oropharynx clear Neck- Supple, no thyromegaly Breast- normal symmetry, no nipple inversion,no nipple drainage, no nodules or lumps felt Nodes- no axillary nodes CVS- RRR, no murmur RESP-CTAB ABD-NABS,soft,NT,ND GU- normal external genitalia, vaginal mucosa pink and moist, cervix visualized no growth, SMALL blood form os, minimal thin clear discharge, no CMT, no ovarian masses, uterus normal size EXT- No edema Pulses- Radial, DP- 2+        Assessment & Plan:      Problem List Items Addressed This Visit      Unprioritized   Class 1 obesity    She is doing well with metformin for her glucose intolerance  and weight loss      Relevant Orders   Hemoglobin A1c   Hot flashes    Discussed where to find black cohosh to try      Insomnia    Other Visit Diagnoses    Routine general medical examination at a health care facility    -  Primary   CPE done, fasting labs, STD check, pt to schedule mammogram, TDAP/FLU given    Relevant Orders   CBC with Differential/Platelet   Comprehensive metabolic panel   Hemoglobin A1c   Screening for STD (sexually transmitted disease)       Relevant Orders   WET PREP FOR [place], YEAST, CLUE (Completed)   C. trachomatis/N. gonorrhoeae RNA   HIV Antibody (routine testing w rflx)   Pre-diabetes       Relevant Orders   Hemoglobin A1c   Cervical cancer screening       Relevant Orders   Pap IG w/ reflex to HPV when ASC-U   Encounter for screening mammogram for malignant neoplasm of breast       Relevant Orders   MM 3D SCREEN BREAST BILATERAL   Need for hepatitis C screening test       Relevant Orders   Hepatitis C antibody   Perimenopause       Need for immunization against influenza  Relevant Orders   Flu Vaccine QUAD 36+ mos IM (Completed)   Need for tetanus, diphtheria, and acellular pertussis (Tdap) vaccine in patient of adolescent age or older       Relevant Orders   Tdap vaccine greater than or equal to 7yo IM (Completed)      Note: This dictation was prepared with Dragon dictation along with smaller phrase technology. Any transcriptional errors that result from this process are unintentional.

## 2020-06-22 LAB — COMPREHENSIVE METABOLIC PANEL
AG Ratio: 1.6 (calc) (ref 1.0–2.5)
ALT: 18 U/L (ref 6–29)
AST: 28 U/L (ref 10–30)
Albumin: 4.5 g/dL (ref 3.6–5.1)
Alkaline phosphatase (APISO): 81 U/L (ref 31–125)
BUN: 15 mg/dL (ref 7–25)
CO2: 22 mmol/L (ref 20–32)
Calcium: 10.1 mg/dL (ref 8.6–10.2)
Chloride: 102 mmol/L (ref 98–110)
Creat: 0.88 mg/dL (ref 0.50–1.10)
Globulin: 2.8 g/dL (calc) (ref 1.9–3.7)
Glucose, Bld: 77 mg/dL (ref 65–99)
Potassium: 4.2 mmol/L (ref 3.5–5.3)
Sodium: 138 mmol/L (ref 135–146)
Total Bilirubin: 0.4 mg/dL (ref 0.2–1.2)
Total Protein: 7.3 g/dL (ref 6.1–8.1)

## 2020-06-22 LAB — CBC WITH DIFFERENTIAL/PLATELET
Absolute Monocytes: 780 cells/uL (ref 200–950)
Basophils Absolute: 42 cells/uL (ref 0–200)
Basophils Relative: 0.5 %
Eosinophils Absolute: 473 cells/uL (ref 15–500)
Eosinophils Relative: 5.7 %
HCT: 38.9 % (ref 35.0–45.0)
Hemoglobin: 13 g/dL (ref 11.7–15.5)
Lymphs Abs: 2490 cells/uL (ref 850–3900)
MCH: 30.7 pg (ref 27.0–33.0)
MCHC: 33.4 g/dL (ref 32.0–36.0)
MCV: 92 fL (ref 80.0–100.0)
MPV: 10.9 fL (ref 7.5–12.5)
Monocytes Relative: 9.4 %
Neutro Abs: 4515 cells/uL (ref 1500–7800)
Neutrophils Relative %: 54.4 %
Platelets: 362 10*3/uL (ref 140–400)
RBC: 4.23 10*6/uL (ref 3.80–5.10)
RDW: 12.1 % (ref 11.0–15.0)
Total Lymphocyte: 30 %
WBC: 8.3 10*3/uL (ref 3.8–10.8)

## 2020-06-22 LAB — C. TRACHOMATIS/N. GONORRHOEAE RNA
C. trachomatis RNA, TMA: NOT DETECTED
N. gonorrhoeae RNA, TMA: NOT DETECTED

## 2020-06-22 LAB — HEPATITIS C ANTIBODY
Hepatitis C Ab: NONREACTIVE
SIGNAL TO CUT-OFF: 0.01 (ref <1.00)

## 2020-06-22 LAB — HEMOGLOBIN A1C
Hgb A1c MFr Bld: 5.6 % of total Hgb (ref <5.7)
Mean Plasma Glucose: 114 (calc)
eAG (mmol/L): 6.3 (calc)

## 2020-06-22 LAB — HIV ANTIBODY (ROUTINE TESTING W REFLEX): HIV 1&2 Ab, 4th Generation: NONREACTIVE

## 2020-06-23 LAB — PAP IG W/ RFLX HPV ASCU

## 2020-07-14 ENCOUNTER — Emergency Department (HOSPITAL_COMMUNITY): Payer: BC Managed Care – PPO

## 2020-07-14 ENCOUNTER — Emergency Department (HOSPITAL_COMMUNITY)
Admission: EM | Admit: 2020-07-14 | Discharge: 2020-07-14 | Disposition: A | Discharge status: Home / Self Care | Payer: BC Managed Care – PPO | Attending: Emergency Medicine | Admitting: Emergency Medicine

## 2020-07-14 DIAGNOSIS — R0789 Other chest pain: Secondary | ICD-10-CM

## 2020-07-14 DIAGNOSIS — Z859 Personal history of malignant neoplasm, unspecified: Secondary | ICD-10-CM | POA: Diagnosis not present

## 2020-07-14 DIAGNOSIS — J45909 Unspecified asthma, uncomplicated: Secondary | ICD-10-CM | POA: Insufficient documentation

## 2020-07-14 DIAGNOSIS — R55 Syncope and collapse: Secondary | ICD-10-CM | POA: Diagnosis not present

## 2020-07-14 DIAGNOSIS — R079 Chest pain, unspecified: Secondary | ICD-10-CM | POA: Diagnosis not present

## 2020-07-14 DIAGNOSIS — R0602 Shortness of breath: Secondary | ICD-10-CM | POA: Diagnosis not present

## 2020-07-14 DIAGNOSIS — I440 Atrioventricular block, first degree: Secondary | ICD-10-CM | POA: Diagnosis not present

## 2020-07-14 LAB — BASIC METABOLIC PANEL
Anion gap: 10 (ref 5–15)
BUN: 13 mg/dL (ref 6–20)
CO2: 26 mmol/L (ref 22–32)
Calcium: 10.2 mg/dL (ref 8.9–10.3)
Chloride: 103 mmol/L (ref 98–111)
Creatinine, Ser: 0.86 mg/dL (ref 0.44–1.00)
GFR, Estimated: >60 mL/min (ref >60)
Glucose, Bld: 98 mg/dL (ref 70–99)
Potassium: 4.3 mmol/L (ref 3.5–5.1)
Sodium: 139 mmol/L (ref 135–145)

## 2020-07-14 LAB — CBC
HCT: 39.5 % (ref 36.0–46.0)
Hemoglobin: 13.2 g/dL (ref 12.0–15.0)
MCH: 30.8 pg (ref 26.0–34.0)
MCHC: 33.4 g/dL (ref 30.0–36.0)
MCV: 92.1 fL (ref 80.0–100.0)
Platelets: 298 10*3/uL (ref 150–400)
RBC: 4.29 MIL/uL (ref 3.87–5.11)
RDW: 11.9 % (ref 11.5–15.5)
WBC: 7 10*3/uL (ref 4.0–10.5)
nRBC: 0 % (ref 0.0–0.2)

## 2020-07-14 LAB — TROPONIN I (HIGH SENSITIVITY): Troponin I (High Sensitivity): 3 ng/L (ref <18)

## 2020-07-14 LAB — I-STAT BETA HCG BLOOD, ED (MC, WL, AP ONLY): I-stat hCG, quantitative: <5 m[IU]/mL (ref <5)

## 2020-07-14 NOTE — ED Provider Notes (Signed)
[facility] EMERGENCY DEPARTMENT Provider Note   CSN: [id number] Arrival date & time: 07/14/20  1021     History No chief complaint on file.   Megan Harvey is a 44 y.o. female.  Patient is a 44 year old female with past medical history of asthma.  She presents today for evaluation of chest pain.  Patient was at work this morning when she took a drink of water.  Shortly afterward, she developed a severe, stabbing pain to her chest.  This lasted for approximately 10 minutes, then began to improve.  She still describes mild discomfort, but denies any shortness of breath or diaphoresis.  Patient with no prior cardiac history and reports no recent exertional symptoms.  The history is provided by the patient.       Past Medical History:  Diagnosis Date  . Allergy   . Asthma    childhood  . Seizures [facility])     Patient Active Problem List   Diagnosis Date Noted  . Hot flashes 06/21/2020  . Insomnia 06/21/2020  . Class 1 obesity 03/29/2020  . Allergy   . Asthma   . Cancer ([place])   . Seizure ([place]) 02/21/2015    Past Surgical History:  Procedure Laterality Date  . BILATERAL CARPAL TUNNEL RELEASE       OB History    Gravida  0   Para  0   Term  0   Preterm  0   AB  0   Living        SAB  0   TAB  0   Ectopic  0   Multiple      Live Births              Family History  Problem Relation Age of Onset  . Heart disease Mother   . Hypertension Mother   . Diabetes Mother   . Arthritis Mother   . Hyperlipidemia Mother   . Heart disease Father   . Hypertension Father   . Diabetes Father   . Diabetes Maternal Grandmother   . Diabetes Paternal Grandmother   . Cancer Paternal Grandmother   . Alcohol abuse Sister   . Alcohol abuse Brother   . Alcohol abuse Brother   . Alcohol abuse Daughter     Social History   Tobacco Use  . Smoking status: Never Smoker  . Smokeless tobacco: Never Used  Vaping Use  . Vaping Use: Never used    Substance Use Topics  . Alcohol use: No  . Drug use: No    Home Medications Prior to Admission medications   Medication Sig Start Date End Date Taking? Authorizing Provider  ibuprofen (ADVIL,MOTRIN) 600 MG tablet Take 1 tablet (600 mg total) by mouth every 6 (six) hours as needed. 05/14/17   Noe Gens, PA-C  metFORMIN (GLUCOPHAGE) 500 MG tablet Take 1 tablet (500 mg total) by mouth daily with breakfast. 04/01/20   Alycia Rossetti, MD  traZODone (DESYREL) 50 MG tablet Take 1 tablet (50 mg total) by mouth at bedtime as needed for sleep. 06/21/20   Alycia Rossetti, MD    Allergies    Penicillins, Bee venom, and Penicillins  Review of Systems   Review of Systems  All other systems reviewed and are negative.   Physical Exam Updated Vital Signs BP 121/87 (BP Location: Left Arm)   Pulse 70   Temp 98.6 F (37 C) (Oral)   Resp 14   SpO2 100%  Physical Exam Vitals and nursing note reviewed.  Constitutional:      General: She is not in acute distress.    Appearance: She is well-developed. She is not diaphoretic.  HENT:     Head: Normocephalic and atraumatic.  Cardiovascular:     Rate and Rhythm: Normal rate and regular rhythm.     Heart sounds: No murmur heard.  No friction rub. No gallop.   Pulmonary:     Effort: Pulmonary effort is normal. No respiratory distress.     Breath sounds: Normal breath sounds. No wheezing.  Abdominal:     General: Bowel sounds are normal. There is no distension.     Palpations: Abdomen is soft.     Tenderness: There is no abdominal tenderness.  Musculoskeletal:        General: Normal range of motion.     Cervical back: Normal range of motion and neck supple.  Skin:    General: Skin is warm and dry.  Neurological:     Mental Status: She is alert and oriented to person, place, and time.     ED Results / Procedures / Treatments   Labs (all labs ordered are listed, but only abnormal results are displayed) Labs Reviewed  CBC  BASIC  METABOLIC PANEL  I-STAT BETA HCG BLOOD, ED ([facility], [facility], [facility] ONLY)  TROPONIN I (HIGH SENSITIVITY)  TROPONIN I (HIGH SENSITIVITY)    EKG EKG Interpretation  Date/Time:  Wednesday July 14 2020 10:23:55 EST Ventricular Rate:  66 PR Interval:  214 QRS Duration: 86 QT Interval:  392 QTC Calculation: 410 R Axis:   81 Text Interpretation: Sinus rhythm with 1st degree A-V block Otherwise normal ECG Confirmed by Veryl Speak [phone]) on 07/14/2020 12:06:17 PM   Radiology DG Chest 2 View  Result Date: 07/14/2020 CLINICAL DATA:  Syncope.  Chest pain. EXAM: CHEST - 2 VIEW COMPARISON:  01/01/2020 FINDINGS: The heart size and mediastinal contours are within normal limits. Both lungs are clear. The visualized skeletal structures are unremarkable. IMPRESSION: No active cardiopulmonary disease. Electronically Signed   By: Misty Stanley M.D.   On: 07/14/2020 10:49    Procedures Procedures (including critical care time)  Medications Ordered in ED Medications - No data to display  ED Course  I have reviewed the triage vital signs and the nursing notes.  Pertinent labs & imaging results that were available during my care of the patient were reviewed by me and considered in my medical decision making (see chart for details).    MDM Rules/Calculators/A&P  Patient presenting here with complaints of chest discomfort that started after she drank water today at work.  This sounds atypical for cardiac pain and work-up is unremarkable.  I highly suspect patient experienced esophageal spasm.  She is feeling better now and requesting to go home.  She does have a history of type 2 diabetes, but no other cardiac risk factors.  She has had no exertional symptoms despite working out regularly and I feel comfortable with discharge.  Patient to return as needed for any problems.  Final Clinical Impression(s) / ED Diagnoses Final diagnoses:  None    Rx / DC Orders ED Discharge Orders    None       Veryl Speak, MD 07/14/20 1322

## 2020-07-14 NOTE — Discharge Instructions (Addendum)
Return to the emergency department if you experience any new and/or concerning symptoms.

## 2020-07-14 NOTE — ED Notes (Signed)
Pt refused 2nd trop

## 2020-07-14 NOTE — ED Triage Notes (Signed)
Pt here from work with c/o chest pain after drinking some water , no sob , cbg 99 , no complaints of pain at present

## 2021-01-06 ENCOUNTER — Encounter: Payer: Self-pay | Admitting: Nurse Practitioner

## 2021-01-06 ENCOUNTER — Other Ambulatory Visit: Payer: Self-pay

## 2021-01-06 ENCOUNTER — Ambulatory Visit: Payer: BC Managed Care – PPO | Admitting: Nurse Practitioner

## 2021-01-06 VITALS — BP 110/76 | HR 85 | Temp 97.3°F | Ht 62.0 in | Wt 170.0 lb

## 2021-01-06 DIAGNOSIS — M25561 Pain in right knee: Secondary | ICD-10-CM

## 2021-01-06 DIAGNOSIS — R7303 Prediabetes: Secondary | ICD-10-CM | POA: Diagnosis not present

## 2021-01-06 MED ORDER — METFORMIN HCL 500 MG PO TABS: 500.0000 mg | ORAL_TABLET | Freq: Every day | ORAL | 3 refills | Status: AC

## 2021-01-06 MED ORDER — PREDNISONE 10 MG PO TABS: 10.0000 mg | ORAL_TABLET | Freq: Every day | ORAL | 0 refills | Status: DC

## 2021-01-06 NOTE — Progress Notes (Signed)
Subjective:    Patient ID: Megan Harvey, female    DOB: [birth year], 45 y.o.   MRN: [id number]  HPI: [name] is a 45 y.o. female presenting for right leg pain.  Chief Complaint  Patient presents with  . Pain    Right leg for 4 days, fluid on right knee   LEG PAIN Swollen since Sunday; woke up Sunday with it.  No known injury.  Pain starts in knee and travels immediately above and below knee. Duration: days Involved knee: right Mechanism of injury: no injury Location:diffuse Onset: sudden Severity: 9/10  Quality:  Sharp shooting Frequency: constant Radiation: no Aggravating factors: walking, touching it  Alleviating factors:  Status: worse Treatments attempted: elevation, ice, cream, Tylneol, ibuprofen, brace  Relief with NSAIDs?:  no Weakness with weight bearing or walking: no Sensation of giving way: no Locking: yes Popping: no Bruising: no Swelling: yes Redness: no Paresthesias/decreased sensation: no Fevers: no  Treatment: Tylenol/ibuprofen  Allergies  Allergen Reactions  . Penicillins Anaphylaxis  . Bee Venom Swelling  . Penicillins Other (See Comments)    Unknown reaction    Outpatient Encounter Medications as of 01/06/2021  Medication Sig  . ibuprofen (ADVIL,MOTRIN) 600 MG tablet Take 1 tablet (600 mg total) by mouth every 6 (six) hours as needed.  . predniSONE (DELTASONE) 10 MG tablet Take 1 tablet (10 mg total) by mouth daily with breakfast. Take 40mg  on days 1-2. Take 30mg  on days 3-4. Take 20mg  on days 5-6. Take 10mg  on days 7-8. Take 5mg  on days 9-10, then stop.  . traZODone (DESYREL) 50 MG tablet Take 1 tablet (50 mg total) by mouth at bedtime as needed for sleep.  . [DISCONTINUED] metFORMIN (GLUCOPHAGE) 500 MG tablet Take 1 tablet (500 mg total) by mouth daily with breakfast.  . metFORMIN (GLUCOPHAGE) 500 MG tablet Take 1 tablet (500 mg total) by mouth daily with breakfast.   No facility-administered encounter medications on file as of  01/06/2021.    Patient Active Problem List   Diagnosis Date Noted  . Hot flashes 06/21/2020  . Insomnia 06/21/2020  . Diabetes mellitus ([place]) 04/23/2020  . Class 1 obesity 03/29/2020  . Allergy   . Asthma   . Cancer ([place])   . Seizure ([place]) 02/21/2015    Past Medical History:  Diagnosis Date  . Allergy   . Asthma    childhood  . Seizures ([place])     Relevant past medical, surgical, family and social history reviewed and updated as indicated. Interim medical history since our last visit reviewed.  Review of Systems Per HPI unless specifically indicated above     Objective:    BP 110/76   Pulse 85   Temp (!) 97.3 F (36.3 C)   Ht 5\' 2"  (1.575 m)   Wt 170 lb (77.1 kg)   LMP 09/11/2018   SpO2 100%   BMI 31.09 kg/m   Wt Readings from Last 3 Encounters:  01/06/21 170 lb (77.1 kg)  06/21/20 167 lb (75.8 kg)  03/29/20 175 lb (79.4 kg)    Physical Exam Vitals and nursing note reviewed.  Constitutional:      General: She is not in acute distress.    Appearance: Normal appearance. She is not toxic-appearing.  HENT:     Head: Normocephalic and atraumatic.  Musculoskeletal:        General: Tenderness present. No signs of injury. Normal range of motion.     Right lower leg: Tenderness present. No deformity.  No edema.     Left lower leg: No tenderness or bony tenderness. No edema.     Right ankle: Normal pulse.     Left ankle: Normal pulse.       Legs:     Comments: Right leg exquisitely tender to light touch in the area marked above.  No skin changes, redness, or swelling noted.  Skin:    General: Skin is warm and dry.     Capillary Refill: Capillary refill takes less than 2 seconds.     Coloration: Skin is not jaundiced or pale.     Findings: No erythema.  Neurological:     Mental Status: She is alert and oriented to person, place, and time.     Gait: Gait abnormal (favoring right leg when walking due to pain).  Psychiatric:        Mood and Affect: Mood normal.         Behavior: Behavior normal.        Thought Content: Thought content normal.        Judgment: Judgment normal.       Assessment & Plan:  1. Prediabetes Chronic.  Currently taking metformin daily with breakfast.  Last A1c in November 2021 5.6%.  Discussed if remains in this range, may be able to discontinue Metformin completely.  HgbA1c checked today.  Follow up pending blood work. - metFORMIN (GLUCOPHAGE) 500 MG tablet; Take 1 tablet (500 mg total) by mouth daily with breakfast.  Dispense: 90 tablet; Refill: 3 - Hemoglobin A1c  2. Acute pain of right knee Acute.  Given exquisite pain on examination without significant swelling, redness, or discoloration, question gout.  Will check uric acid level and treat with prednisone taper.  Follow up in clinic if symptoms not improve after prednisone.  - Uric Acid - predniSONE (DELTASONE) 10 MG tablet; Take 1 tablet (10 mg total) by mouth daily with breakfast. Take 40mg  on days 1-2. Take 30mg  on days 3-4. Take 20mg  on days 5-6. Take 10mg  on days 7-8. Take 5mg  on days 9-10, then stop.  Dispense: 21 tablet; Refill: 0    Follow up plan: Return if symptoms worsen or fail to improve.

## 2021-01-06 NOTE — Patient Instructions (Signed)
Gout  Gout is painful swelling of your joints. Gout is a type of arthritis. It is caused by having too much uric acid in your body. Uric acid is a chemical that is made when your body breaks down substances called purines. If your body has too much uric acid, sharp crystals can form and build up in your joints. This causes pain and swelling. Gout attacks can happen quickly and be very painful (acute gout). Over time, the attacks can affect more joints and happen more often (chronic gout). What are the causes?  Too much uric acid in your blood. This can happen because: ? Your kidneys do not remove enough uric acid from your blood. ? Your body makes too much uric acid. ? You eat too many foods that are high in purines. These foods include organ meats, some seafood, and beer.  Trauma or stress. What increases the risk?  Having a family history of gout.  Being female and middle-aged.  Being female and having gone through menopause.  Being very overweight (obese).  Drinking alcohol, especially beer.  Not having enough water in the body (being dehydrated).  Losing weight too quickly.  Having an organ transplant.  Having lead poisoning.  Taking certain medicines.  Having kidney disease.  Having a skin condition called psoriasis. What are the signs or symptoms? An attack of acute gout usually happens in just one joint. The most common place is the big toe. Attacks often start at night. Other joints that may be affected include joints of the feet, ankle, knee, fingers, wrist, or elbow. Symptoms of an attack may include:  Very bad pain.  Warmth.  Swelling.  Stiffness.  Shiny, red, or purple skin.  Tenderness. The affected joint may be very painful to touch.  Chills and fever. Chronic gout may cause symptoms more often. More joints may be involved. You may also have white or yellow lumps (tophi) on your hands or feet or in other areas near your joints.   How is this  treated?  Treatment for this condition has two phases: treating an acute attack and preventing future attacks.  Acute gout treatment may include: ? NSAIDs. ? Steroids. These are taken by mouth or injected into a joint. ? Colchicine. This medicine relieves pain and swelling. It can be given by mouth or through an IV tube.  Preventive treatment may include: ? Taking small doses of NSAIDs or colchicine daily. ? Using a medicine that reduces uric acid levels in your blood. ? Making changes to your diet. You may need to see a food expert (dietitian) about what to eat and drink to prevent gout. Follow these instructions at home: During a gout attack  If told, put ice on the painful area: ? Put ice in a plastic bag. ? Place a towel between your skin and the bag. ? Leave the ice on for 20 minutes, 2-3 times a day.  Raise (elevate) the painful joint above the level of your heart as often as you can.  Rest the joint as much as possible. If the joint is in your leg, you may be given crutches.  Follow instructions from your doctor about what you cannot eat or drink.   Avoiding future gout attacks  Eat a low-purine diet. Avoid foods and drinks such as: ? Liver. ? Kidney. ? Anchovies. ? Asparagus. ? Herring. ? Mushrooms. ? Mussels. ? Beer.  Stay at a healthy weight. If you want to lose weight, talk with your doctor. Do   not lose weight too fast.  Start or continue an exercise plan as told by your doctor. Eating and drinking  Drink enough fluids to keep your pee (urine) pale yellow.  If you drink alcohol: ? Limit how much you use to:  0-1 drink a day for women.  0-2 drinks a day for men. ? Be aware of how much alcohol is in your drink. In the [place] one drink equals one 12 oz bottle of beer (355 mL), one 5 oz glass of wine (148 mL), or one 1 oz glass of hard liquor (44 mL). General instructions  Take over-the-counter and prescription medicines only as told by your doctor.  Do  not drive or use heavy machinery while taking prescription pain medicine.  Return to your normal activities as told by your doctor. Ask your doctor what activities are safe for you.  Keep all follow-up visits as told by your doctor. This is important. Contact a doctor if:  You have another gout attack.  You still have symptoms of a gout attack after 10 days of treatment.  You have problems (side effects) because of your medicines.  You have chills or a fever.  You have burning pain when you pee (urinate).  You have pain in your lower back or belly. Get help right away if:  You have very bad pain.  Your pain cannot be controlled.  You cannot pee. Summary  Gout is painful swelling of the joints.  The most common site of pain is the big toe, but it can affect other joints.  Medicines and avoiding some foods can help to prevent and treat gout attacks. This information is not intended to replace advice given to you by your health care provider. Make sure you discuss any questions you have with your health care provider. Document Revised: 02/20/2018 Document Reviewed: 02/20/2018 Elsevier Patient Education  2021 Elsevier Inc.  

## 2021-01-07 ENCOUNTER — Other Ambulatory Visit: Payer: Self-pay | Admitting: Family Medicine

## 2021-01-07 LAB — HEMOGLOBIN A1C
Hgb A1c MFr Bld: 5.6 % of total Hgb (ref <5.7)
Mean Plasma Glucose: 114 mg/dL
eAG (mmol/L): 6.3 mmol/L

## 2021-01-07 LAB — URIC ACID: Uric Acid, Serum: 3.2 mg/dL (ref 2.5–7.0)

## 2021-01-12 ENCOUNTER — Telehealth: Payer: Self-pay

## 2021-01-12 DIAGNOSIS — M79605 Pain in left leg: Secondary | ICD-10-CM

## 2021-01-13 MED ORDER — NAPROXEN 250 MG PO TABS: 250.0000 mg | ORAL_TABLET | Freq: Two times a day (BID) | ORAL | 0 refills | Status: AC

## 2021-01-13 NOTE — Telephone Encounter (Signed)
Naproxen 500mg  not covered through pt ins. Sent covered dose naproxen 250mg  bid, 60tabs no rf

## 2021-01-13 NOTE — Telephone Encounter (Signed)
Lm for pt to call back, sent medication to pharmacy

## 2021-01-13 NOTE — Telephone Encounter (Signed)
She can stop Metformin and we can keep a close eye on her A1c - can plan to recheck on October.  For ongoing pain, please send in naproxen 500 mg 1 tablet PO every 12 hours with a meal #30 no refills.  We can get her in with Ortho if pain is not improving.

## 2021-02-28 ENCOUNTER — Inpatient Hospital Stay: Admission: RE | Admit: 2021-02-28 | Payer: BC Managed Care – PPO | Source: Ambulatory Visit

## 2021-03-01 IMAGING — DX DG CHEST 2V
2 series · 2 of 2 positions shown · non-contrast
Comparison: 01/01/2020

CLINICAL DATA: Syncope.  Chest pain.

EXAM:
CHEST - 2 VIEW

[chest pa]
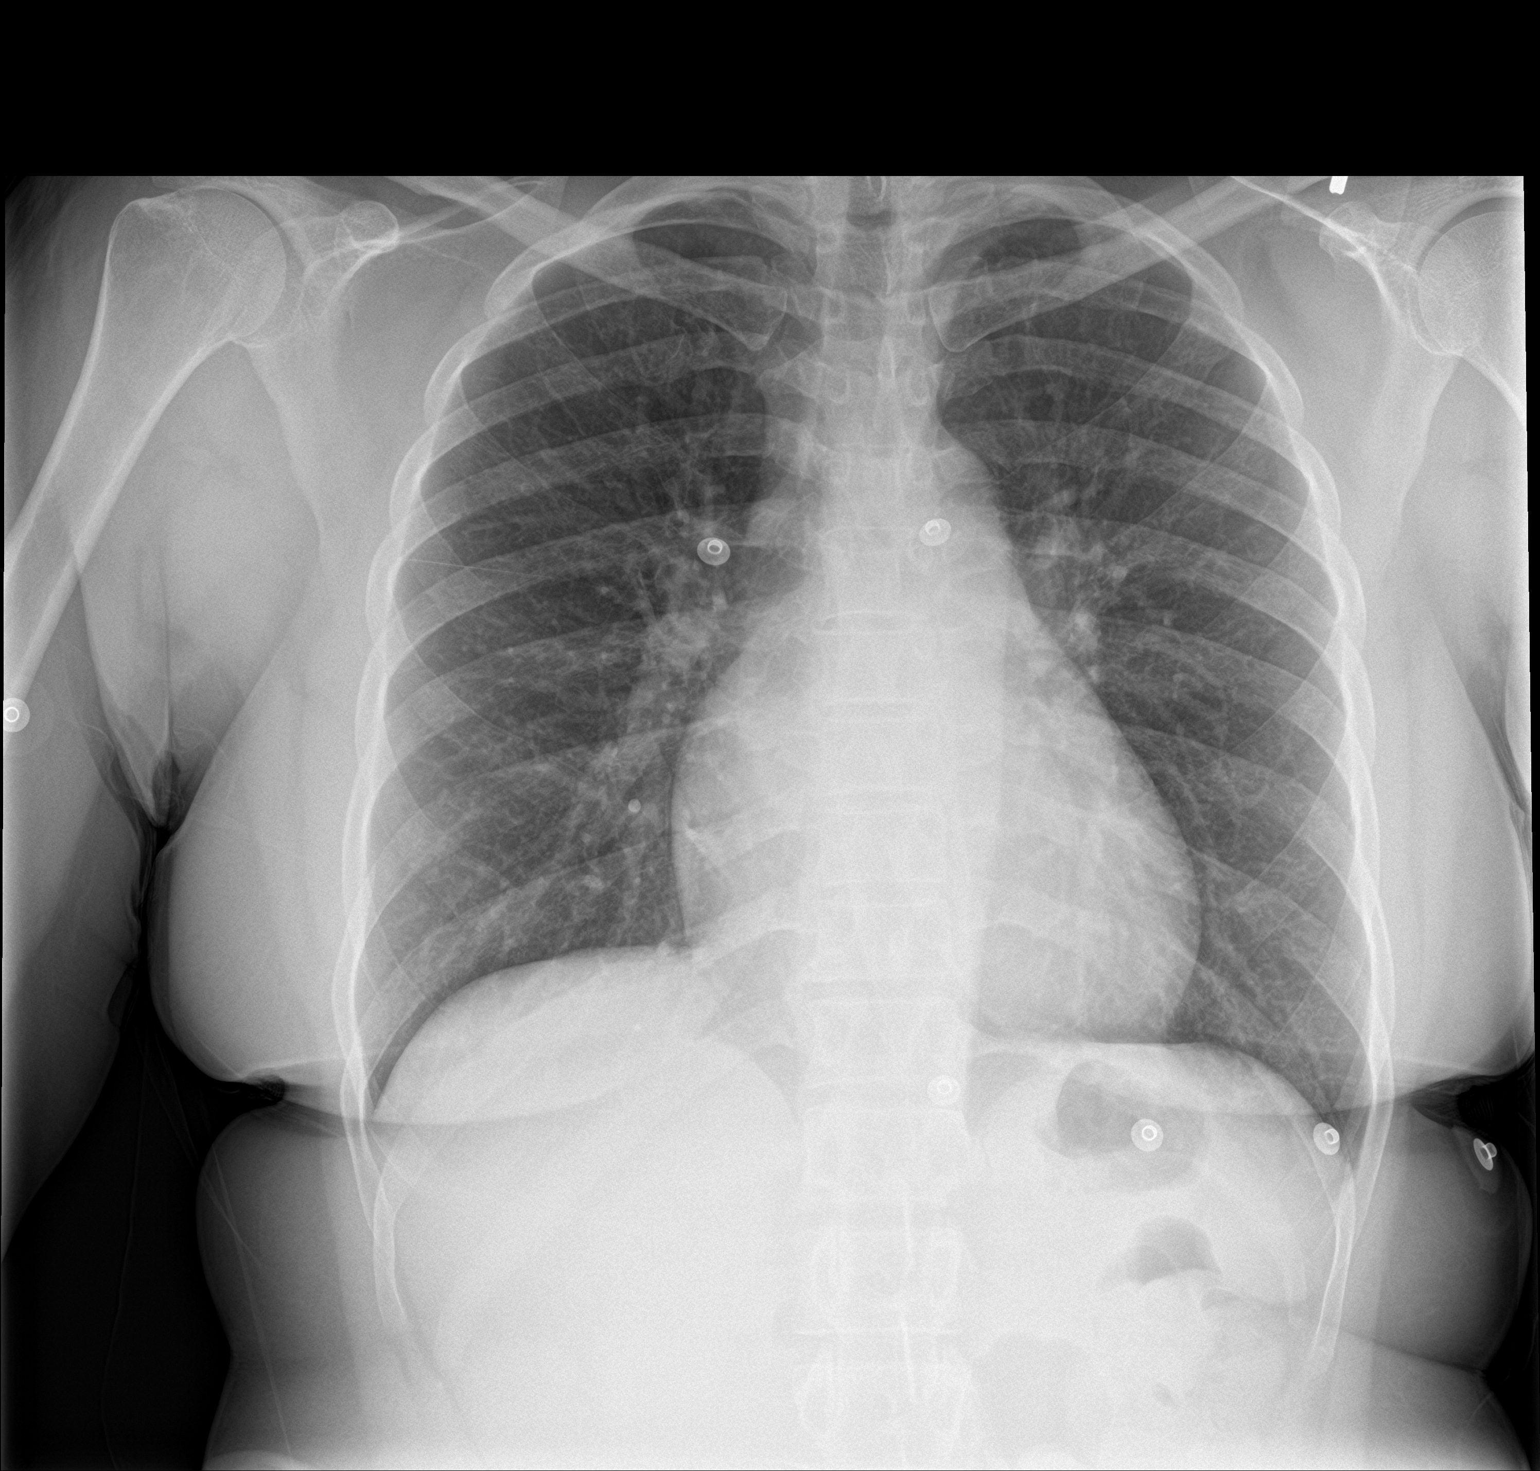

[chest lat]
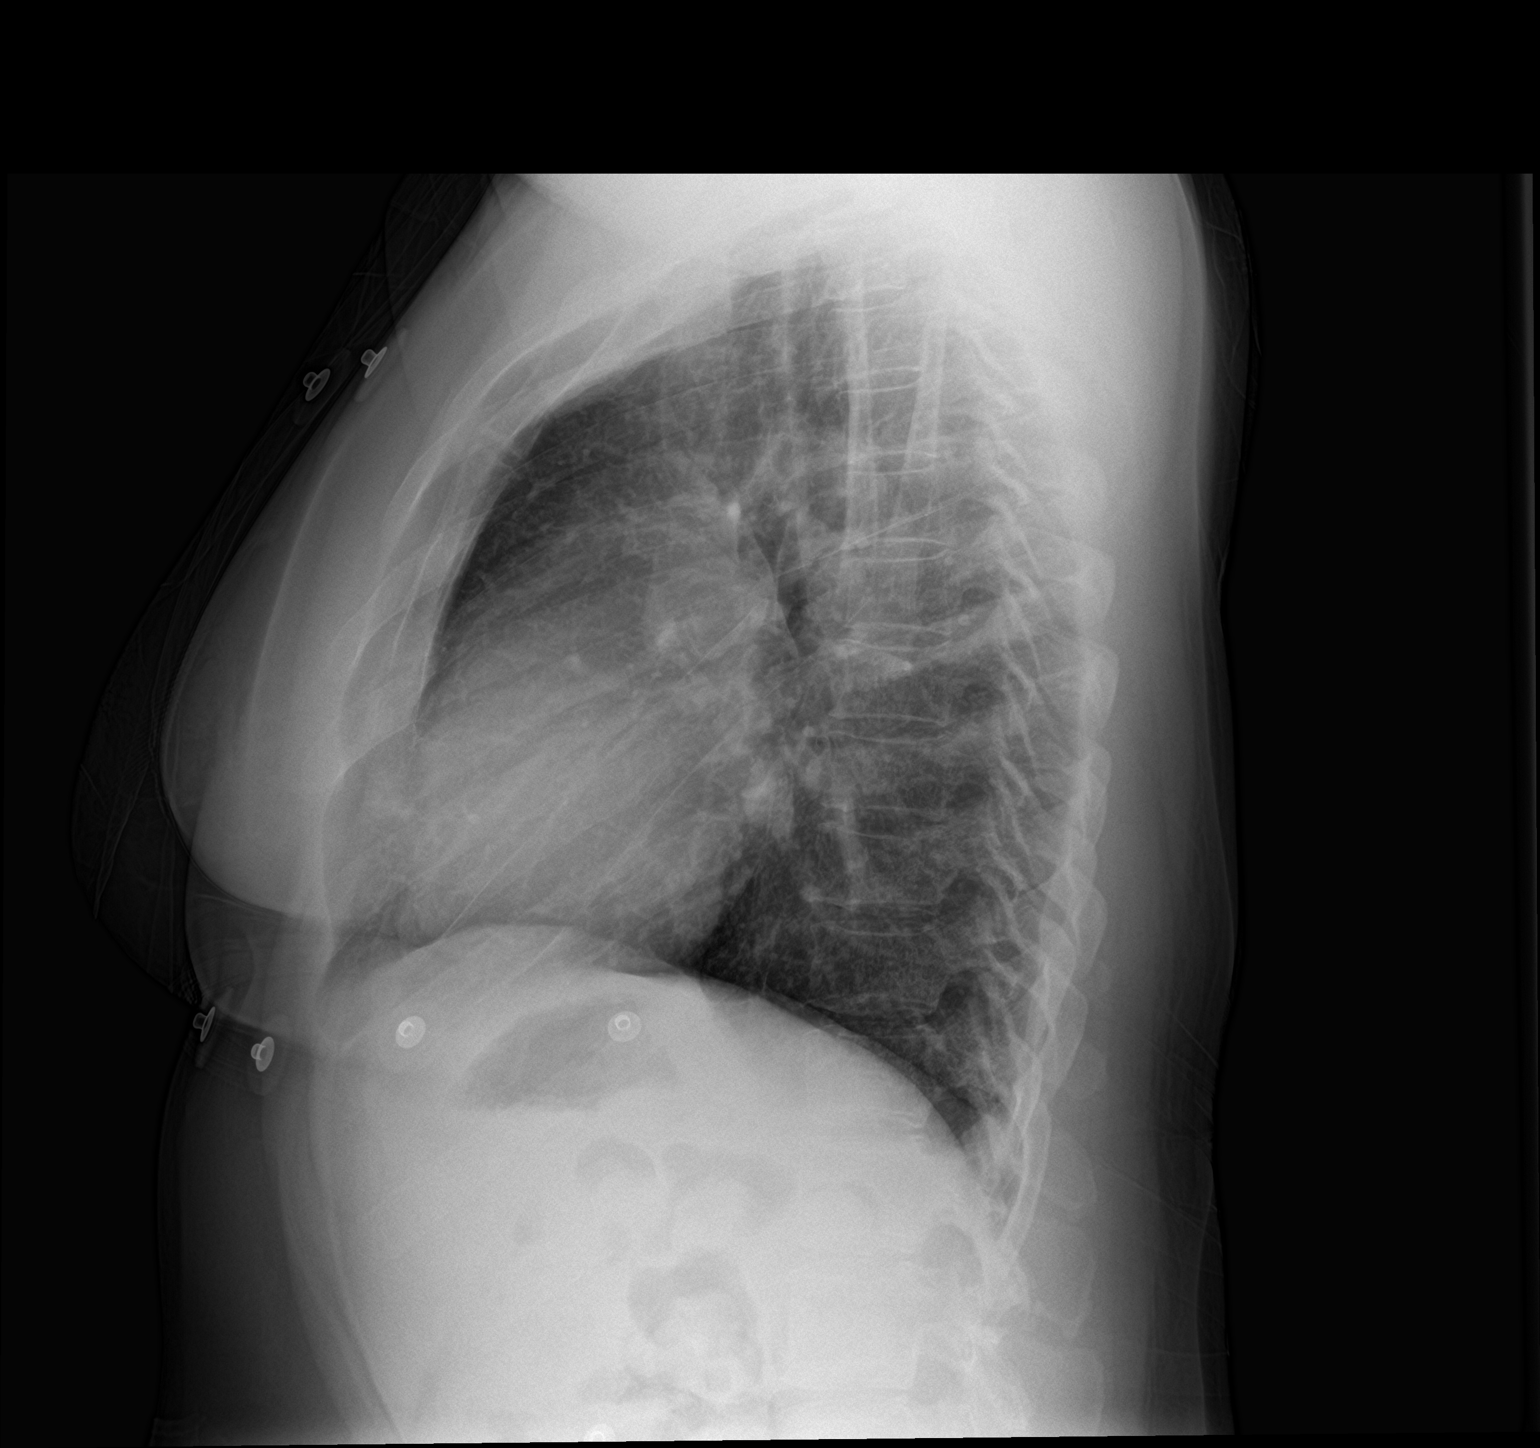

[2 of 2 positions shown; findings below may reference images not displayed]

FINDINGS: The heart size and mediastinal contours are within normal limits.
Both lungs are clear. The visualized skeletal structures are
unremarkable.
IMPRESSION: No active cardiopulmonary disease.

## 2021-06-22 ENCOUNTER — Telehealth: Payer: Self-pay | Admitting: Nurse Practitioner

## 2021-06-22 NOTE — Telephone Encounter (Signed)
Patient received STD screening last year; wants to know if tests should be repeated. Previous partner told her today about positive result for HIV. Appointment scheduled for 11/21. Please advise ASAP at [phone]

## 2021-06-22 NOTE — Telephone Encounter (Signed)
Call placed to patient.   States that prior partner has tested positive for HIV. Reports that she is no longer with partner, but has had exposure with him after he may have contracted virus.   Appointment scheduled for STD screening. Of note, patient requesting swabs and blood work to be done.

## 2021-06-23 ENCOUNTER — Ambulatory Visit: Payer: BC Managed Care – PPO | Admitting: Nurse Practitioner

## 2021-06-23 ENCOUNTER — Encounter: Payer: Self-pay | Admitting: Nurse Practitioner

## 2021-06-23 ENCOUNTER — Other Ambulatory Visit: Payer: Self-pay

## 2021-06-23 VITALS — BP 130/80 | HR 55 | Temp 97.3°F | Resp 16 | Ht 62.0 in | Wt 154.0 lb

## 2021-06-23 DIAGNOSIS — Z113 Encounter for screening for infections with a predominantly sexual mode of transmission: Secondary | ICD-10-CM | POA: Diagnosis not present

## 2021-06-23 DIAGNOSIS — Z124 Encounter for screening for malignant neoplasm of cervix: Secondary | ICD-10-CM | POA: Diagnosis not present

## 2021-06-23 DIAGNOSIS — Z202 Contact with and (suspected) exposure to infections with a predominantly sexual mode of transmission: Secondary | ICD-10-CM | POA: Diagnosis not present

## 2021-06-23 LAB — WET PREP FOR TRICH, YEAST, CLUE

## 2021-06-23 NOTE — Progress Notes (Signed)
Subjective:    Patient ID: Megan Harvey, female    DOB: [birth year], 45 y.o.   MRN: [id number]  HPI: Megan Harvey is a 45 y.o. female presenting for STD screen.  Chief Complaint  Patient presents with   Gynecologic Exam   STD SCREENING Patient reports she may have previously been exposed to HIV and is requesting testing today.   History of sexually transmitted diseases: no Previous sexually transmitted disease screening: yes Genital lesions: no Vaginal discharge: no Dysuria: no Swollen lymph nodes: no Fevers: no Rash: no  Allergies  Allergen Reactions   Penicillins Anaphylaxis   Bee Venom Swelling   Penicillins Other (See Comments)    Unknown reaction    Outpatient Encounter Medications as of 06/23/2021  Medication Sig   ibuprofen (ADVIL,MOTRIN) 600 MG tablet Take 1 tablet (600 mg total) by mouth every 6 (six) hours as needed.   metFORMIN (GLUCOPHAGE) 500 MG tablet Take 1 tablet (500 mg total) by mouth daily with breakfast.   naproxen (NAPROSYN) 250 MG tablet Take 1 tablet (250 mg total) by mouth 2 (two) times daily with a meal.   traZODone (DESYREL) 50 MG tablet TAKE 1/2 TO 1 TABLET(25 TO 50 MG) BY MOUTH AT BEDTIME AS NEEDED FOR SLEEP   predniSONE (DELTASONE) 10 MG tablet Take 1 tablet (10 mg total) by mouth daily with breakfast. Take 40mg  on days 1-2. Take 30mg  on days 3-4. Take 20mg  on days 5-6. Take 10mg  on days 7-8. Take 5mg  on days 9-10, then stop. (Patient not taking: Reported on 06/23/2021)   No facility-administered encounter medications on file as of 06/23/2021.    Patient Active Problem List   Diagnosis Date Noted   Hot flashes 06/21/2020   Insomnia 06/21/2020   Diabetes mellitus ([place]) 04/23/2020   Class 1 obesity 03/29/2020   Allergy    Asthma    Cancer ([place])    Seizure ([place]) 02/21/2015    Past Medical History:  Diagnosis Date   Allergy    Asthma    childhood   Seizures ([place])     Relevant past medical, surgical, family and social history  reviewed and updated as indicated. Interim medical history since our last visit reviewed.  Review of Systems Per HPI unless specifically indicated above     Objective:    BP 130/80   Pulse (!) 55   Temp (!) 97.3 F (36.3 C) (Temporal)   Resp 16   Ht 5\' 2"  (1.575 m)   Wt 154 lb (69.9 kg)   LMP 09/11/2018   SpO2 90%   BMI 28.17 kg/m   Wt Readings from Last 3 Encounters:  06/23/21 154 lb (69.9 kg)  01/06/21 170 lb (77.1 kg)  06/21/20 167 lb (75.8 kg)    Physical Exam Vitals and nursing note reviewed. Exam conducted with a chaperone present (CS).  Constitutional:      General: She is not in acute distress.    Appearance: Normal appearance. She is not toxic-appearing.  Genitourinary:    General: Normal vulva.     Labia:        Right: No rash, tenderness or lesion.        Left: No rash, tenderness or lesion.      Urethra: No prolapse.     Vagina: Vaginal discharge present.     Cervix: Normal.     Uterus: Normal.      Adnexa: Right adnexa normal and left adnexa normal.  Lymphadenopathy:     Lower  Body: No right inguinal adenopathy. No left inguinal adenopathy.  Skin:    General: Skin is warm and dry.     Coloration: Skin is not jaundiced or pale.     Findings: No erythema.  Neurological:     Mental Status: She is alert and oriented to person, place, and time.     Motor: No weakness.     Gait: Gait normal.       Assessment & Plan:  1. Screening for cervical cancer Pap last year was not run with HPV, will recheck today.    - PAP,TP IMGw/HPV RNA,rflx [id number],63/78  2. Screening for STD (sexually transmitted disease) Screening for all STI per patient request.  Small amount of discharge today but patient asymptomatic.    - WET PREP FOR [place], YEAST, CLUE - HIV Antibody (routine testing w rflx) - RPR - C. trachomatis/N. gonorrhoeae RNA - C. trachomatis/N. gonorrhoeae RNA - C. trachomatis/N. gonorrhoeae RNA - HSV(herpes simplex vrs) 1+2 ab-IgG  3. Exposure to  STD  - WET PREP FOR [place], YEAST, CLUE - HIV Antibody (routine testing w rflx) - RPR - C. trachomatis/N. gonorrhoeae RNA - C. trachomatis/N. gonorrhoeae RNA - C. trachomatis/N. gonorrhoeae RNA - HSV(herpes simplex vrs) 1+2 ab-IgG     Follow up plan: No follow-ups on file.

## 2021-06-23 NOTE — Telephone Encounter (Signed)
Noted thank you

## 2021-06-24 LAB — HSV(HERPES SIMPLEX VRS) I + II AB-IGG
HAV 1 IGG,TYPE SPECIFIC AB: 43.3 index — ABNORMAL HIGH
HSV 2 IGG,TYPE SPECIFIC AB: 17.3 index — ABNORMAL HIGH

## 2021-06-24 LAB — C. TRACHOMATIS/N. GONORRHOEAE RNA
C. trachomatis RNA, TMA: NOT DETECTED
C. trachomatis RNA, TMA: NOT DETECTED
C. trachomatis RNA, TMA: NOT DETECTED
N. gonorrhoeae RNA, TMA: NOT DETECTED
N. gonorrhoeae RNA, TMA: NOT DETECTED
N. gonorrhoeae RNA, TMA: NOT DETECTED

## 2021-06-24 LAB — HIV ANTIBODY (ROUTINE TESTING W REFLEX): HIV 1&2 Ab, 4th Generation: NONREACTIVE

## 2021-06-24 LAB — RPR: RPR Ser Ql: NONREACTIVE

## 2021-06-27 LAB — PAP, TP IMAGING W/ HPV RNA, RFLX HPV TYPE 16,18/45: HPV DNA High Risk: NOT DETECTED

## 2021-06-29 ENCOUNTER — Telehealth: Payer: Self-pay

## 2021-06-29 NOTE — Telephone Encounter (Signed)
Pt called in requesting that a copy of her lab results be mailed to her. I verified the correct address as listed in pt's chart.   Cb#: [phone]

## 2021-06-29 NOTE — Telephone Encounter (Signed)
Copy of recent labs mailed to pt's address in chart.

## 2021-07-04 ENCOUNTER — Ambulatory Visit: Payer: BC Managed Care – PPO | Admitting: Nurse Practitioner

## 2021-07-26 ENCOUNTER — Other Ambulatory Visit: Payer: Self-pay

## 2021-07-26 NOTE — Telephone Encounter (Signed)
Refill request from [facility] for trazodone 50mg  as needed at bedtime for sleep.

## 2021-07-27 MED ORDER — TRAZODONE HCL 50 MG PO TABS: ORAL_TABLET | ORAL | 0 refills | Status: DC

## 2021-08-02 ENCOUNTER — Telehealth: Payer: Self-pay

## 2021-08-02 NOTE — Telephone Encounter (Signed)
Patient called conerened about vaginal bleeding. Blood was bright red, abdominal pain.  Last cycle a couple years ago.  Denies recent intercourse.  Advised patient to contact GYN and follow up if they cannot see her in a timely manor.  Patient agreeable.

## 2021-10-27 ENCOUNTER — Other Ambulatory Visit: Payer: Self-pay | Admitting: Nurse Practitioner

## 2022-01-10 DIAGNOSIS — E1142 Type 2 diabetes mellitus with diabetic polyneuropathy: Secondary | ICD-10-CM | POA: Diagnosis not present

## 2022-01-10 DIAGNOSIS — R569 Unspecified convulsions: Secondary | ICD-10-CM | POA: Diagnosis not present

## 2022-01-10 DIAGNOSIS — T7840XS Allergy, unspecified, sequela: Secondary | ICD-10-CM | POA: Diagnosis not present

## 2022-01-10 DIAGNOSIS — F5101 Primary insomnia: Secondary | ICD-10-CM | POA: Diagnosis not present

## 2022-01-10 DIAGNOSIS — J4520 Mild intermittent asthma, uncomplicated: Secondary | ICD-10-CM | POA: Diagnosis not present

## 2022-01-10 DIAGNOSIS — E669 Obesity, unspecified: Secondary | ICD-10-CM | POA: Diagnosis not present

## 2022-09-30 ENCOUNTER — Other Ambulatory Visit: Payer: Self-pay

## 2022-09-30 ENCOUNTER — Encounter (HOSPITAL_COMMUNITY): Payer: Self-pay

## 2022-09-30 ENCOUNTER — Emergency Department (HOSPITAL_COMMUNITY)
Admission: EM | Admit: 2022-09-30 | Discharge: 2022-09-30 | Disposition: A | Discharge status: Home / Self Care | Payer: BC Managed Care – PPO | Attending: Emergency Medicine | Admitting: Emergency Medicine

## 2022-09-30 DIAGNOSIS — R21 Rash and other nonspecific skin eruption: Secondary | ICD-10-CM | POA: Diagnosis present

## 2022-09-30 DIAGNOSIS — T7840XA Allergy, unspecified, initial encounter: Secondary | ICD-10-CM | POA: Insufficient documentation

## 2022-09-30 MED ORDER — METHYLPREDNISOLONE SODIUM SUCC 125 MG IJ SOLR
125.0000 mg | Freq: Once | INTRAMUSCULAR | Status: AC
  Administered 2022-09-30: 125 mg via INTRAVENOUS
  Filled 2022-09-30: qty 2

## 2022-09-30 MED ORDER — FAMOTIDINE IN NACL 20-0.9 MG/50ML-% IV SOLN
20.0000 mg | Freq: Once | INTRAVENOUS | Status: AC
  Administered 2022-09-30: 20 mg via INTRAVENOUS
  Filled 2022-09-30: qty 50

## 2022-09-30 MED ORDER — DIPHENHYDRAMINE HCL 50 MG/ML IJ SOLN
25.0000 mg | Freq: Once | INTRAMUSCULAR | Status: AC
  Administered 2022-09-30: 25 mg via INTRAVENOUS
  Filled 2022-09-30: qty 1

## 2022-09-30 MED ORDER — SODIUM CHLORIDE 0.9 % IV BOLUS
1000.0000 mL | Freq: Once | INTRAVENOUS | Status: AC
  Administered 2022-09-30: 1000 mL via INTRAVENOUS

## 2022-09-30 MED ORDER — PREDNISONE 20 MG PO TABS: 40.0000 mg | ORAL_TABLET | Freq: Every day | ORAL | 0 refills | Status: DC

## 2022-09-30 MED ORDER — EPINEPHRINE 0.3 MG/0.3ML IJ SOAJ: 0.3000 mg | INTRAMUSCULAR | 0 refills | Status: DC | PRN

## 2022-09-30 MED ORDER — IPRATROPIUM-ALBUTEROL 0.5-2.5 (3) MG/3ML IN SOLN
3.0000 mL | Freq: Once | RESPIRATORY_TRACT | Status: AC
  Administered 2022-09-30: 3 mL via RESPIRATORY_TRACT
  Filled 2022-09-30: qty 3

## 2022-09-30 MED ORDER — EPINEPHRINE 0.3 MG/0.3ML IJ SOAJ
0.3000 mg | Freq: Once | INTRAMUSCULAR | Status: AC
  Administered 2022-09-30: 0.3 mg via INTRAMUSCULAR

## 2022-09-30 MED ORDER — FAMOTIDINE 20 MG PO TABS: 20.0000 mg | ORAL_TABLET | Freq: Two times a day (BID) | ORAL | 0 refills | Status: DC

## 2022-09-30 NOTE — ED Provider Notes (Signed)
[facility] Emergency Department Provider Note MRN:  [id number]  Arrival date & time: 09/30/22     Chief Complaint   Allergic Reaction   History of Present Illness   Megan Harvey is a 47 y.o. year-old female presents to the ED with chief complaint of allergic reaction after eating fries and ketchup tonight around 10pm.  Reports rash, throat swelling, SOB, and lip tingling.  Was given epipen in triage.  Reports moderate improvement of throat swelling sensation.  History provided by patient.   Review of Systems  Pertinent positive and negative review of systems noted in HPI.    Physical Exam   Vitals:   09/30/22 0345 09/30/22 0400  BP: 116/60 110/69  Pulse: 83 80  Resp: 17 17  Temp:    SpO2: 100% 100%    CONSTITUTIONAL:  well-appearing, NAD NEURO:  Alert and oriented x 3, CN 3-12 grossly intact EYES:  eyes equal and reactive ENT/NECK:  Supple, no stridor, mild erythema, no edema CARDIO:  normal rate, regular rhythm, appears well-perfused  PULM:  No respiratory distress, diminished lung sounds GI/GU:  non-distended,  MSK/SPINE:  No gross deformities, no edema, moves all extremities  SKIN:  diffuse maculopapular rash on extremities   *Additional and/or pertinent findings included in MDM below  Diagnostic and Interventional Summary    EKG Interpretation  Date/Time:  Saturday September 30 2022 02:27:14 EST Ventricular Rate:  73 PR Interval:  189 QRS Duration: 93 QT Interval:  407 QTC Calculation: 449 R Axis:   72 Text Interpretation: Sinus rhythm Consider left ventricular hypertrophy Baseline wander in lead(s) V5 Interpretation limited secondary to artifact Confirmed by Ripley Fraise [phone]) on 09/30/2022 2:40:35 AM       Labs Reviewed - No data to display  No orders to display    Medications  EPINEPHrine (EPI-PEN) injection 0.3 mg (0.3 mg Intramuscular Given 09/30/22 0123)  methylPREDNISolone sodium succinate (SOLU-MEDROL) 125 mg/2  mL injection 125 mg (125 mg Intravenous Given 09/30/22 0148)  diphenhydrAMINE (BENADRYL) injection 25 mg (25 mg Intravenous Given 09/30/22 0147)  famotidine (PEPCID) IVPB 20 mg premix (0 mg Intravenous Stopped 09/30/22 0221)  ipratropium-albuterol (DUONEB) 0.5-2.5 (3) MG/3ML nebulizer solution 3 mL (3 mLs Nebulization Given 09/30/22 0221)  sodium chloride 0.9 % bolus 1,000 mL (0 mLs Intravenous Stopped 09/30/22 0410)     Procedures  /  Critical Care .Critical Care  Performed by: Montine Circle, PA-C Authorized by: Montine Circle, PA-C   Critical care provider statement:    Critical care time (minutes):  50   Critical care was necessary to treat or prevent imminent or life-threatening deterioration of the following conditions: anaphylaxis.   Critical care was time spent personally by me on the following activities:  Development of treatment plan with patient or surrogate, discussions with consultants, evaluation of patient's response to treatment, examination of patient, ordering and review of laboratory studies, ordering and review of radiographic studies, ordering and performing treatments and interventions, pulse oximetry, re-evaluation of patient's condition and review of old charts   ED Course and Medical Decision Making  I have reviewed the triage vital signs, the nursing notes, and pertinent available records from the EMR.  Social Determinants Affecting Complexity of Care: Patient has no clinically significant social determinants affecting this chief complaint..   ED Course:    Medical Decision Making Patient here with allergic reaction to unknown substance.  Had throat swelling sensation, hives, SOB, and lip tingling.  Treated with epipen, solumedrol, pepcid, and benadryl in  triage.  Monitored for 4 hours in ED to ensure no rebound.  Patient feeling better after treatment.  States breathing and speaking is back to normal.  Still has some faint rash, but much  improved.  Amount and/or Complexity of Data Reviewed ECG/medicine tests: ordered.  Risk Prescription drug management. Decision regarding hospitalization.     Consultants: No consultations were needed in caring for this patient.   Treatment and Plan: I considered admission due to patient's initial presentation, but after considering the examination and diagnostic results, patient will not require admission and can be discharged with outpatient follow-up.    Final Clinical Impressions(s) / ED Diagnoses     ICD-10-CM   1. Allergic reaction, initial encounter  T78.40XA       ED Discharge Orders          Ordered    EPINEPHrine 0.3 mg/0.3 mL IJ SOAJ injection  As needed        09/30/22 0504    predniSONE (DELTASONE) 20 MG tablet  Daily        09/30/22 0504    famotidine (PEPCID) 20 MG tablet  2 times daily        09/30/22 [id number]              Discharge Instructions Discussed with and Provided to Patient:     Discharge Instructions      Take medications as directed.  If you develop recurrence of throat swelling sensation, you'll need to use the epipen and call 911.    Anytime an epipen is used, you must call 911.  Take benadryl for the next few days.  Take 1-2 capsules every 8 hours.       Montine Circle, PA-C 09/30/22 [id number]    Ripley Fraise, MD 09/30/22 [phone]

## 2022-09-30 NOTE — Discharge Instructions (Addendum)
Take medications as directed.  If you develop recurrence of throat swelling sensation, you'll need to use the epipen and call 911.    Anytime an epipen is used, you must call 911.  Take benadryl for the next few days.  Take 1-2 capsules every 8 hours.

## 2022-09-30 NOTE — ED Provider Triage Note (Signed)
Emergency Medicine Provider Triage Evaluation Note  Megan Harvey , a 47 y.o. female  was evaluated in triage.  Pt complains of allergic reaction.  States she ate ketchup about 1 hour PTA, no hx of allergies to same.  Denies changes in soaps, detergents, or medications.  States she started to feel itchy and took 1 benadryl but since has developed hives, difficulty swallowing, wheezing, etc.  Review of Systems  Positive: Allergic reaction Negative: fever  Physical Exam  BP (!) 144/106   Pulse 93   Resp 18   LMP 09/11/2018   SpO2 100%   Gen:   Awake, no distress   Resp:  Normal effort  MSK:   Moves extremities without difficulty  Other:  Scattered hives, wheezing on exam  Medical Decision Making  Medically screening exam initiated at 1:21 AM.  Appropriate orders placed.  Megan Harvey was informed that the remainder of the evaluation will be completed by another provider, this initial triage assessment does not replace that evaluation, and the importance of remaining in the ED until their evaluation is complete.  Allergic reaction.  Does have diffuse hives, wheezing, and difficulty swallowing.  IM epi given.  PIV established, solu-medrol, benadryl, pepcid ordered.  Charge RN aware, working on Emergency planning/management officer.   Larene Pickett, PA-C 09/30/22 0126

## 2022-09-30 NOTE — ED Triage Notes (Signed)
Suspected allergic reaction after eating some ketchup and fries ~10PM tonight. Has had several recurrent episodes of hives but nothing as intense as tonight.   Admits to throat swelling sensation, lip tingling and vocal changes.

## 2023-03-31 ENCOUNTER — Emergency Department (HOSPITAL_COMMUNITY)
Admission: EM | Admit: 2023-03-31 | Discharge: 2023-04-01 | Disposition: A | Discharge status: Home / Self Care | Payer: BC Managed Care – PPO | Attending: Emergency Medicine | Admitting: Emergency Medicine

## 2023-03-31 ENCOUNTER — Encounter (HOSPITAL_COMMUNITY): Payer: Self-pay

## 2023-03-31 DIAGNOSIS — T7840XA Allergy, unspecified, initial encounter: Secondary | ICD-10-CM

## 2023-03-31 MED ORDER — DIPHENHYDRAMINE HCL 50 MG/ML IJ SOLN
INTRAMUSCULAR | Status: AC
  Administered 2023-03-31: 50 mg via INTRAVENOUS
  Filled 2023-03-31: qty 1

## 2023-03-31 MED ORDER — EPINEPHRINE 0.3 MG/0.3ML IJ SOAJ
0.3000 mg | Freq: Once | INTRAMUSCULAR | Status: AC
  Administered 2023-03-31: 0.3 mg via INTRAMUSCULAR

## 2023-03-31 MED ORDER — LORAZEPAM 2 MG/ML IJ SOLN
1.0000 mg | Freq: Once | INTRAMUSCULAR | Status: AC
  Administered 2023-03-31: 1 mg via INTRAVENOUS
  Filled 2023-03-31: qty 1

## 2023-03-31 MED ORDER — METHYLPREDNISOLONE SODIUM SUCC 125 MG IJ SOLR
INTRAMUSCULAR | Status: AC
  Administered 2023-03-31: 125 mg via INTRAVENOUS
  Filled 2023-03-31: qty 2

## 2023-03-31 MED ORDER — METHYLPREDNISOLONE SODIUM SUCC 125 MG IJ SOLR: 125.0000 mg | Freq: Once | INTRAMUSCULAR | Status: AC

## 2023-03-31 MED ORDER — IPRATROPIUM-ALBUTEROL 0.5-2.5 (3) MG/3ML IN SOLN
3.0000 mL | Freq: Once | RESPIRATORY_TRACT | Status: AC
  Administered 2023-03-31: 3 mL via RESPIRATORY_TRACT
  Filled 2023-03-31: qty 3

## 2023-03-31 MED ORDER — FAMOTIDINE IN NACL 20-0.9 MG/50ML-% IV SOLN
INTRAVENOUS | Status: AC
  Administered 2023-03-31: 20 mg via INTRAVENOUS
  Filled 2023-03-31: qty 50

## 2023-03-31 MED ORDER — DIPHENHYDRAMINE HCL 50 MG/ML IJ SOLN: 25.0000 mg | Freq: Once | INTRAMUSCULAR | Status: AC

## 2023-03-31 MED ORDER — FAMOTIDINE IN NACL 20-0.9 MG/50ML-% IV SOLN: 20.0000 mg | Freq: Once | INTRAVENOUS | Status: AC

## 2023-03-31 NOTE — ED Notes (Addendum)
EPI PEN INJECTION ADMINISTERED AT 2159 AS EMERGENCY in triage due to delay in getting room in the back. Dr. Adela Lank informed.

## 2023-03-31 NOTE — ED Notes (Addendum)
Pt checked in for allergic reaction, lips are swollen, unable to talk, audible and labored breathing. Pt unable to answer questions.

## 2023-03-31 NOTE — ED Triage Notes (Signed)
Pt co ing in with allergic reaction to unknown reason, she is having difficulty breathing, difficulty speaking, and some generalized hives over the body

## 2023-03-31 NOTE — ED Provider Notes (Signed)
[facility] Emergency Department Provider Note MRN:  [id number]  Arrival date & time: 04/01/23     Chief Complaint   Allergic Reaction   History of Present Illness   Megan Harvey is a 47 y.o. year-old female presents to the ED with chief complaint of allergic reaction.  Given epipen in triage.  Patient unable to answer questions due to extremis.  Further history deferred due to acuity of condition.  Chart review shows history of the same.  Unknown allergy.  Has not needed intubation in the past.  History provided by patient.   Review of Systems  Pertinent positive and negative review of systems noted in HPI.    Physical Exam   Vitals:   04/01/23 0100 04/01/23 0115  BP: 130/78   Pulse: (!) 59 61  Resp: 16 15  Temp:    SpO2: 100% 100%    CONSTITUTIONAL:  in extremis NEURO:  Alert and oriented x 3 EYES:  eyes equal and reactive ENT/NECK:  Supple, + high pitched expiratory stridor CARDIO:  tachycardia, regular rhythm, appears well-perfused  PULM:  Tachypnea, +respiratory distress GI/GU:  non-distended,  MSK/SPINE:  No gross deformities, no edema, moves all extremities  SKIN:  no rash seen on my exam, atraumatic   *Additional and/or pertinent findings included in MDM below  Diagnostic and Interventional Summary    EKG Interpretation Date/Time:  Saturday March 31 2023 22:11:23 EDT Ventricular Rate:  122 PR Interval:  176 QRS Duration:  80 QT Interval:  355 QTC Calculation: 506 R Axis:   52  Text Interpretation: Sinus tachycardia Probable left ventricular hypertrophy Borderline T abnormalities, lateral leads Borderline prolonged QT interval Artifact in lead(s) I III aVF V1 V2 V3 V4 V5 V6 Confirmed by Nicanor Alcon, April ([id number]) on 03/31/2023 11:17:08 PM       Labs Reviewed - No data to display  No orders to display    Medications  diphenhydrAMINE (BENADRYL) injection 25 mg (50 mg Intravenous Given 03/31/23 2208)  methylPREDNISolone sodium  succinate (SOLU-MEDROL) 125 mg/2 mL injection 125 mg (125 mg Intravenous Given 03/31/23 2208)  ipratropium-albuterol (DUONEB) 0.5-2.5 (3) MG/3ML nebulizer solution 3 mL (3 mLs Nebulization Given 03/31/23 2208)  famotidine (PEPCID) IVPB 20 mg premix (0 mg Intravenous Stopped 03/31/23 2230)  LORazepam (ATIVAN) injection 1 mg (1 mg Intravenous Given 03/31/23 2216)  EPINEPHrine (EPI-PEN) injection 0.3 mg (0.3 mg Intramuscular Given 03/31/23 2159)  EPINEPHrine (EPI-PEN) injection 0.3 mg (0.3 mg Intramuscular Given 03/31/23 2149)     Procedures  /  Critical Care .Critical Care  Performed by: [name], PA-C Authorized by: [name], PA-C   Critical care provider statement:    Critical care time (minutes):  30   Critical care was time spent personally by me on the following activities:  Development of treatment plan with patient or surrogate, discussions with consultants, evaluation of patient's response to treatment, examination of patient, ordering and review of laboratory studies, ordering and review of radiographic studies, ordering and performing treatments and interventions, pulse oximetry, re-evaluation of patient's condition and review of old charts   ED Course and Medical Decision Making  I have reviewed the triage vital signs, the nursing notes, and pertinent available records from the EMR.  Social Determinants Affecting Complexity of Care: Patient has no clinically significant social determinants affecting this chief complaint..   ED Course: Clinical Course as of 04/01/23 0156  Sat Mar 31, 2023  2232 Patient seems to be improving.  Stridor is no longer present.  Moving air well.  100% on RA. [RB]  Sun Apr 01, 2023  0155 I reassessed the patient several times throughout the night.  I was messaged by the nurse and informed that the patient is requesting to be discharged.  I reassessed her again, and she is speaking with normal voice.  She feels well.  She is requesting to be  discharged.  Given marked improvement of symptoms, feel that this is appropriate.  Return precautions discussed.  She has an EpiPen at home.  I have encouraged her to follow-up with allergy. [RB]    Clinical Course User Index [RB] [name], PA-C    Medical Decision Making Patient seen immediately by me and Dr. Adela Lank.  Patient initially brought in tachycardic and hypoxic with high-pitched stridor and concern for anaphylactic reaction.  She was given EpiPen in triage, Solu-Medrol, Pepcid, and Benadryl in the treatment room and began to rapidly improved.  Will continue to monitor.  Risk OTC drugs. Prescription drug management.         Consultants: No consultations were needed in caring for this patient.   Treatment and Plan: I considered admission due to patient's initial presentation, but after considering the examination and diagnostic results, patient will not require admission and can be discharged with outpatient follow-up.  Patient seen by and discussed with attending physician, Dr. Adela Lank.  Final Clinical Impressions(s) / ED Diagnoses     ICD-10-CM   1. Allergic reaction, initial encounter  T78.40XA       ED Discharge Orders          Ordered    predniSONE (DELTASONE) 20 MG tablet  Daily        04/01/23 0155    famotidine (PEPCID) 20 MG tablet  2 times daily        04/01/23 0155    diphenhydrAMINE (BENADRYL) 25 MG tablet  Every 6 hours PRN        04/01/23 0155              Discharge Instructions Discussed with and Provided to Patient:     Discharge Instructions      You need to follow-up with the allergy center.  If you experience similar symptoms, use your EpiPen and call 911.  Please do not drive yourself to the hospital, it is safer for you and those around you to call 911.  Please take medications as prescribed.       [name], PA-C 04/01/23 0157    Melene Plan, DO 04/01/23 1910

## 2023-04-01 MED ORDER — FAMOTIDINE 20 MG PO TABS: 20.0000 mg | ORAL_TABLET | Freq: Two times a day (BID) | ORAL | 0 refills | Status: AC

## 2023-04-01 MED ORDER — PREDNISONE 20 MG PO TABS: 40.0000 mg | ORAL_TABLET | Freq: Every day | ORAL | 0 refills | Status: AC

## 2023-04-01 MED ORDER — DIPHENHYDRAMINE HCL 25 MG PO TABS: 25.0000 mg | ORAL_TABLET | Freq: Four times a day (QID) | ORAL | 0 refills | Status: AC | PRN

## 2023-04-01 NOTE — Discharge Instructions (Addendum)
You need to follow-up with the allergy center.  If you experience similar symptoms, use your EpiPen and call 911.  Please do not drive yourself to the hospital, it is safer for you and those around you to call 911.  Please take medications as prescribed.

## 2023-04-02 ENCOUNTER — Emergency Department (HOSPITAL_COMMUNITY)
Admission: EM | Admit: 2023-04-02 | Discharge: 2023-04-02 | Disposition: A | Discharge status: Home / Self Care | Payer: BC Managed Care – PPO | Attending: Emergency Medicine | Admitting: Emergency Medicine

## 2023-04-02 DIAGNOSIS — T7840XA Allergy, unspecified, initial encounter: Secondary | ICD-10-CM | POA: Diagnosis present

## 2023-04-02 MED ORDER — EPINEPHRINE 0.3 MG/0.3ML IJ SOAJ
0.3000 mg | Freq: Once | INTRAMUSCULAR | Status: AC
  Administered 2023-04-02: 0.3 mg via INTRAMUSCULAR

## 2023-04-02 MED ORDER — EPINEPHRINE 0.3 MG/0.3ML IJ SOAJ: 0.3000 mg | Freq: Once | INTRAMUSCULAR | Status: DC

## 2023-04-02 MED ORDER — METHYLPREDNISOLONE SODIUM SUCC 125 MG IJ SOLR
125.0000 mg | Freq: Once | INTRAMUSCULAR | Status: AC
  Administered 2023-04-02: 125 mg via INTRAVENOUS

## 2023-04-02 MED ORDER — DIPHENHYDRAMINE HCL 50 MG/ML IJ SOLN
25.0000 mg | Freq: Once | INTRAMUSCULAR | Status: AC
  Administered 2023-04-02: 25 mg via INTRAVENOUS

## 2023-04-02 MED ORDER — FAMOTIDINE IN NACL 20-0.9 MG/50ML-% IV SOLN
20.0000 mg | Freq: Once | INTRAVENOUS | Status: AC
  Administered 2023-04-02: 20 mg via INTRAVENOUS

## 2023-04-02 MED ORDER — EPINEPHRINE 0.3 MG/0.3ML IJ SOAJ: 0.3000 mg | INTRAMUSCULAR | 0 refills | Status: AC | PRN

## 2023-04-02 MED ORDER — SODIUM CHLORIDE 0.9 % IV SOLN: Freq: Once | INTRAVENOUS | Status: AC

## 2023-04-02 NOTE — ED Notes (Signed)
This RN reviewed discharge instructions with patient. She verbalized understanding and denied any further questions. PT well appearing upon discharge and reports tolerable pain. Pt ambulated with stable gait to exit. Pt endorses ride home.  

## 2023-04-02 NOTE — ED Provider Notes (Signed)
Clinical Course as of 04/02/23 0941  Mon Apr 02, 2023  9937 Here w/ questionable syncopal episode, rash, and throat discomfort. No stridor or voice changes. Handling secretions well. Got EpiPen at 0445 as well as other allergic reaction medications. Already received meds for similar episode yesterday.  Lenore Manner at 0845 - go to her prior allergiest [CD]    Clinical Course User Index [CD] Rhys Martini, DO    Physical Exam  BP 132/71   Pulse (!) 57   Temp 98.2 F (36.8 C) (Oral)   Resp 15   LMP 09/11/2018   SpO2 100%   Physical Exam Vitals and nursing note reviewed.  Constitutional:      General: She is not in acute distress.    Appearance: She is well-developed.  HENT:     Head: Normocephalic and atraumatic.  Eyes:     Conjunctiva/sclera: Conjunctivae normal.  Cardiovascular:     Rate and Rhythm: Normal rate and regular rhythm.     Heart sounds: No murmur heard. Pulmonary:     Effort: Pulmonary effort is normal. No respiratory distress.     Breath sounds: No stridor.     Comments: Saturating well on room air Abdominal:     General: There is no distension.  Musculoskeletal:        General: No swelling.     Cervical back: Neck supple.  Skin:    General: Skin is warm and dry.     Capillary Refill: Capillary refill takes less than 2 seconds.  Neurological:     Mental Status: She is alert and oriented to person, place, and time.  Psychiatric:        Mood and Affect: Mood normal.     Procedures  Procedures  ED Course / MDM   Clinical Course as of 04/02/23 0637  [facility] Apr 02, 2023  1696 Here w/ questionable syncopal episode, rash, and throat discomfort. No stridor or voice changes. Handling secretions well. Got EpiPen at 0445 as well as other allergic reaction medications. Already received meds for similar episode yesterday.  Lenore Manner at 0845 - go to her prior allergiest [CD]    Clinical Course User Index [CD] Rhys Martini, DO   Medical Decision  Making Problems Addressed: Allergic reaction, initial encounter: complicated acute illness or injury  Amount and/or Complexity of Data Reviewed External Data Reviewed: notes.  Risk Prescription drug management.   As noted above, patient presented for concern of possible allergic reaction.  She did receive an EpiPen injection at 0445.  Since that time she has had no shortness of breath, stridor, vomiting, diarrhea, or evidence of airway compromise.  She is evaluated 0900 without any acute worsening.  She does describe the sensation of her skin burning.  No rashes seen at this time.  She already has Benadryl and steroids at home which she has been taking.  Exam is not currently consistent with anaphylaxis.  No evidence of urticaria at this time.  Unclear etiology of paresthesias.  She has seen an allergist in the past.  She will plan to follow-up with them as an outpatient.  She is also provided with information regarding follow-up with dermatology if needed.  She is given strict return precautions.  She states understanding and agreement with plan for discharge at this time.       Rhys Martini, DO 04/02/23 7893    Maia Plan, MD 04/02/23 [phone]

## 2023-04-02 NOTE — ED Triage Notes (Signed)
Patient arrives POV. States she is having a reaction to a medication, Patient brought back in wheelchair urgently.

## 2023-04-02 NOTE — Discharge Instructions (Signed)
Please continue steroids and other medications from visit yesterday. Keep your epi pen with you at all times. Follow-up with your primary care doctor, you may need some formal allergy testing. Return here for new concerns.

## 2023-04-02 NOTE — ED Provider Notes (Signed)
[place] EM[facility]GENCY DEPARTMENT AT [facility] Provider Note   CSN: [id number] Arrival date & time: 04/02/23  0431     History  Chief Complaint  Patient presents with   Allergic Reaction    DALESHA MEW is a 47 y.o. female.  The history is provided by the patient and medical records.  Allergic Reaction Presenting symptoms: rash    47 y.o. F with hx of allergies, asthma, obesity, DM, presenting to the ED with allergic reaction.  Family was trying to get her out of the car when she had witnessed syncopal event in parking lot.  Off duty RN assisted her into building.  Patient point to her throat and chest, she does have noted rash.  Unclear etiology.  Per chart review, seen yesterday for similar.  Home Medications Prior to Admission medications   Medication Sig Start Date End Date Taking? Authorizing Provider  diphenhydrAMINE (BENADRYL) 25 MG tablet Take 1 tablet (25 mg total) by mouth every 6 (six) hours as needed. 04/01/23   [name], PA-C  EPINEPHrine 0.3 mg/0.3 mL IJ SOAJ injection Inject 0.3 mg into the muscle as needed for anaphylaxis. 09/30/22   [name], PA-C  famotidine (PEPCID) 20 MG tablet Take 1 tablet (20 mg total) by mouth 2 (two) times daily. 04/01/23   [name], PA-C  ibuprofen (ADVIL,MOTRIN) 600 MG tablet Take 1 tablet (600 mg total) by mouth every 6 (six) hours as needed. 05/14/17   Lurene Shadow, PA-C  metFORMIN (GLUCOPHAGE) 500 MG tablet Take 1 tablet (500 mg total) by mouth daily with breakfast. 01/06/21   Valentino Nose, NP  naproxen (NAPROSYN) 250 MG tablet Take 1 tablet (250 mg total) by mouth 2 (two) times daily with a meal. 01/13/21   Valentino Nose, NP  predniSONE (DELTASONE) 20 MG tablet Take 2 tablets (40 mg total) by mouth daily. 04/01/23   [name], PA-C  traZODone (DESYREL) 50 MG tablet TAKE 1/2 TO 1 TABLET(25 TO 50 MG) BY MOUTH AT BEDTIME AS NEEDED FOR SLEEP 10/28/21   Donita Brooks, MD      Allergies     Penicillins, Penicillins, and Bee venom    Review of Systems   Review of Systems  Skin:  Positive for rash.  All other systems reviewed and are negative.   Physical Exam Updated Vital Signs Pulse 84   LMP 09/11/2018   SpO2 100%   Physical Exam Vitals and nursing note reviewed.  Constitutional:      Appearance: She is well-developed.     Comments: Appears very panicked, tearful, hyperventilating  HENT:     Head: Normocephalic and atraumatic.     Mouth/Throat:     Comments: No lip/tongue swelling, no drooling, handling secretions Eyes:     Conjunctiva/sclera: Conjunctivae normal.     Pupils: Pupils are equal, round, and reactive to light.  Cardiovascular:     Rate and Rhythm: Normal rate and regular rhythm.     Heart sounds: Normal heart sounds.  Pulmonary:     Effort: Pulmonary effort is normal. No respiratory distress.     Breath sounds: Normal breath sounds. No rhonchi.     Comments: Lung sounds are clear, no stridor, immediately placed on monitor and sats 100% Abdominal:     General: Bowel sounds are normal.     Palpations: Abdomen is soft.  Musculoskeletal:        General: Normal range of motion.     Cervical back: Normal range of  motion.  Skin:    General: Skin is warm and dry.     Comments: Urticarial rash noted across neck, chest, and BUE  Neurological:     Mental Status: She is alert and oriented to person, place, and time.     ED Results / Procedures / Treatments   Labs (all labs ordered are listed, but only abnormal results are displayed) Labs Reviewed - No data to display  EKG None  Radiology No results found.  Procedures Procedures    CRITICAL CARE Performed by: Garlon Hatchet   Total critical care time: 45 minutes  Critical care time was exclusive of separately billable procedures and treating other patients.  Critical care was necessary to treat or prevent imminent or life-threatening deterioration.  Critical care was time spent  personally by me on the following activities: development of treatment plan with patient and/or surrogate as well as nursing, discussions with consultants, evaluation of patient's response to treatment, examination of patient, obtaining history from patient or surrogate, ordering and performing treatments and interventions, ordering and review of laboratory studies, ordering and review of radiographic studies, pulse oximetry and re-evaluation of patient's condition.   Medications Ordered in ED Medications  methylPREDNISolone sodium succinate (SOLU-MEDROL) 125 mg/2 mL injection 125 mg (125 mg Intravenous Given 04/02/23 0439)  diphenhydrAMINE (BENADRYL) injection 25 mg (25 mg Intravenous Given 04/02/23 0439)  famotidine (PEPCID) IVPB 20 mg premix (0 mg Intravenous Stopped 04/02/23 0608)  EPINEPHrine (EPI-PEN) injection 0.3 mg (0.3 mg Intramuscular Given 04/02/23 0439)  0.9 %  sodium chloride infusion (0 mLs Intravenous Stopped 04/02/23 0608)    ED Course/ Medical Decision Making/ A&P                                Medical Decision Making Risk Prescription drug management.   47 year old female presenting to the ED with allergic reaction.  She was seen yesterday for same.  She was brought urgent back from front door in wheelchair and seen in hallway, will not speak but points to her throat and arms, she does have visible urticarial rash.  She was given dose of IM epi.  Patient then immediately transferred to trauma bay, placed on monitor with stable VS.  Lung sounds are clear, no findings of acute airway compromise.  She does appear to be panicked and hyperventilating.  Doubt true anaphylaxis but does have visible rash so will give IV benadryl, solu-medrol, and pepcid.  Per chart review, has been seen several times for allergic reaction in the past.  Will monitor closely.   5:41 AM Resting comfortably, VS remain stable on RA.  Will need to be monitored here considering she was given epi.  6:34  AM Care signed out to oncoming provider for continued monitoring.  Suspect she will be able to be discharged.  Has seen allergist in the past so may need more urgent follow-up in OP setting.  Final Clinical Impression(s) / ED Diagnoses Final diagnoses:  Allergic reaction, initial encounter    Rx / DC Orders ED Discharge Orders     None         Garlon Hatchet, PA-C 04/02/23 6606    Sabas Sous, MD 04/02/23 [id number]

## 2023-04-02 NOTE — ED Notes (Signed)
Family at bedside. 

## 2023-04-02 NOTE — ED Notes (Signed)
Pt states she feels like her skin is still burning.  No obvious signs of rash or urticaria on torso or arms.  Breath sounds clear, pt states she feels like her breathing is ok.   EDP notified.

## 2023-04-24 ENCOUNTER — Ambulatory Visit (INDEPENDENT_AMBULATORY_CARE_PROVIDER_SITE_OTHER): Payer: BC Managed Care – PPO | Admitting: Orthopaedic Surgery

## 2023-04-24 DIAGNOSIS — M654 Radial styloid tenosynovitis [de Quervain]: Secondary | ICD-10-CM | POA: Insufficient documentation

## 2023-04-24 MED ORDER — METHYLPREDNISOLONE ACETATE 40 MG/ML IJ SUSP
13.3300 mg | INTRAMUSCULAR | Status: AC | PRN
  Administered 2023-04-24: 13.33 mg

## 2023-04-24 MED ORDER — BUPIVACAINE HCL 0.5 % IJ SOLN
0.3300 mL | INTRAMUSCULAR | Status: AC | PRN
  Administered 2023-04-24: .33 mL

## 2023-04-24 MED ORDER — LIDOCAINE HCL 1 % IJ SOLN
0.3000 mL | INTRAMUSCULAR | Status: AC | PRN
  Administered 2023-04-24: .3 mL

## 2023-04-24 NOTE — Progress Notes (Signed)
Office Visit Note   Patient: Megan Harvey           Date of Birth: 10-16-1975           MRN: [id number] Visit Date: 04/24/2023              Requested by: Dot Been, FNP [address] [place],  [place] [address] PCP: Dot Been, FNP   Assessment & Plan: Visit Diagnoses:  1. De Quervain's tenosynovitis, right     Plan: [name] is a 47 year old female with ganglion cyst of the right first dorsal wrist compartment and de Quervain's tenosynovitis.  Disease process explained and treatment options were reviewed and she elected to undergo cortisone injection.  Will immobilize with thumb spica brace which she can wean as symptoms improve.  Questions encouraged and answered.  Follow-up as needed.  Follow-Up Instructions: No follow-ups on file.   Orders:  No orders of the defined types were placed in this encounter.  No orders of the defined types were placed in this encounter.     Procedures: Hand/UE Inj: R extensor compartment 1 for de Quervain's tenosynovitis on 04/24/2023 4:39 PM Indications: pain Details: 25 G needle Medications: 0.3 mL lidocaine 1 %; 0.33 mL bupivacaine 0.5 %; 13.33 mg methylPREDNISolone acetate 40 MG/ML Outcome: tolerated well, no immediate complications Patient was prepped and draped in the usual sterile fashion.       Clinical Data: No additional findings.   Subjective: Chief Complaint  Patient presents with   Right Wrist - Pain    HPI Megan Harvey is a 47 year old female comes in for evaluation of tender right wrist nodule for a while.  Denies any injuries.  Pain with ADLs.  Occasionally pain radiates up into the elbow.  Toradol shot did not help and prednisone helped a little bit.  Has been wearing a wrist immobilizer Review of Systems  Constitutional: Negative.   HENT: Negative.    Eyes: Negative.   Respiratory: Negative.    Cardiovascular: Negative.   Endocrine: Negative.   Musculoskeletal: Negative.   Neurological: Negative.    Hematological: Negative.   Psychiatric/Behavioral: Negative.    All other systems reviewed and are negative.    Objective: Vital Signs: LMP 09/11/2018   Physical Exam Vitals and nursing note reviewed.  Constitutional:      Appearance: She is well-developed.  HENT:     Head: Atraumatic.     Nose: Nose normal.  Eyes:     Extraocular Movements: Extraocular movements intact.  Cardiovascular:     Pulses: Normal pulses.  Pulmonary:     Effort: Pulmonary effort is normal.  Abdominal:     Palpations: Abdomen is soft.  Musculoskeletal:     Cervical back: Neck supple.  Skin:    General: Skin is warm.     Capillary Refill: Capillary refill takes less than 2 seconds.  Neurological:     Mental Status: She is alert. Mental status is at baseline.  Psychiatric:        Behavior: Behavior normal.        Thought Content: Thought content normal.        Judgment: Judgment normal.     Ortho Exam Examination of the right wrist shows a tender nodule over the first dorsal wrist compartment.  Positive Finkelstein's test.  Negative [facility] grind test. Specialty Comments:  No specialty comments available.  Imaging: No results found.   PMFS History: Patient Active Problem List   Diagnosis Date Noted   [name]  Quervain's tenosynovitis, right 04/24/2023   Hot flashes 06/21/2020   Insomnia 06/21/2020   Diabetes mellitus (HCC) 04/23/2020   Class 1 obesity 03/29/2020   Allergy    Asthma    Cancer (HCC)    Seizure (HCC) 02/21/2015   Past Medical History:  Diagnosis Date   Allergy    Asthma    childhood   Seizures (HCC)     Family History  Problem Relation Age of Onset   Heart disease Mother    Hypertension Mother    Diabetes Mother    Arthritis Mother    Hyperlipidemia Mother    Heart disease Father    Hypertension Father    Diabetes Father    Diabetes Maternal Grandmother    Diabetes Paternal Grandmother    Cancer Paternal Grandmother    Alcohol abuse Sister    Alcohol abuse  Brother    Alcohol abuse Brother    Alcohol abuse Daughter     Past Surgical History:  Procedure Laterality Date   BILATERAL CARPAL TUNNEL RELEASE     Social History   Occupational History   Occupation: other    Comment: [employer]  Tobacco Use   Smoking status: Never   Smokeless tobacco: Never  Vaping Use   Vaping status: Never Used  Substance and Sexual Activity   Alcohol use: No   Drug use: No   Sexual activity: Yes    Birth control/protection: None

## 2023-05-01 ENCOUNTER — Other Ambulatory Visit: Payer: Self-pay | Admitting: Family Medicine

## 2023-05-01 DIAGNOSIS — Z1231 Encounter for screening mammogram for malignant neoplasm of breast: Secondary | ICD-10-CM

## 2023-05-10 ENCOUNTER — Ambulatory Visit
Admission: RE | Admit: 2023-05-10 | Discharge: 2023-05-10 | Disposition: A | Discharge status: Home / Self Care | Payer: BC Managed Care – PPO | Source: Ambulatory Visit | Attending: Family Medicine | Admitting: Family Medicine

## 2023-05-10 DIAGNOSIS — Z1231 Encounter for screening mammogram for malignant neoplasm of breast: Secondary | ICD-10-CM

## 2024-04-16 ENCOUNTER — Ambulatory Visit: Admitting: Family Medicine

## 2024-06-23 ENCOUNTER — Other Ambulatory Visit: Payer: Self-pay | Admitting: Family Medicine

## 2024-06-23 DIAGNOSIS — Z1231 Encounter for screening mammogram for malignant neoplasm of breast: Secondary | ICD-10-CM

## 2024-08-08 ENCOUNTER — Ambulatory Visit
